# Patient Record
Sex: Male | Born: 1965 | Race: White | Hispanic: No | Marital: Married | Smoking: Never smoker
Health system: Southern US, Community
[De-identification: ages and names within clinical notes are randomized; demographics above are authoritative.]

## PROBLEM LIST (undated history)

## (undated) DIAGNOSIS — K625 Hemorrhage of anus and rectum: Secondary | ICD-10-CM

## (undated) DIAGNOSIS — I889 Nonspecific lymphadenitis, unspecified: Secondary | ICD-10-CM

## (undated) DIAGNOSIS — E349 Endocrine disorder, unspecified: Secondary | ICD-10-CM

## (undated) DIAGNOSIS — E785 Hyperlipidemia, unspecified: Secondary | ICD-10-CM

## (undated) DIAGNOSIS — E559 Vitamin D deficiency, unspecified: Secondary | ICD-10-CM

## (undated) HISTORY — PX: NO PAST SURGERIES: SHX2092

## (undated) HISTORY — DX: Vitamin D deficiency, unspecified: E55.9

## (undated) HISTORY — DX: Hyperlipidemia, unspecified: E78.5

## (undated) HISTORY — DX: Nonspecific lymphadenitis, unspecified: I88.9

## (undated) HISTORY — DX: Hemorrhage of anus and rectum: K62.5

## (undated) HISTORY — DX: Endocrine disorder, unspecified: E34.9

---

## 2004-04-13 ENCOUNTER — Ambulatory Visit: Payer: Self-pay | Admitting: Internal Medicine

## 2004-10-12 ENCOUNTER — Ambulatory Visit: Payer: Self-pay | Admitting: Internal Medicine

## 2005-01-12 ENCOUNTER — Ambulatory Visit: Payer: Self-pay | Admitting: Internal Medicine

## 2005-02-16 ENCOUNTER — Ambulatory Visit: Payer: Self-pay | Admitting: Internal Medicine

## 2005-05-19 ENCOUNTER — Ambulatory Visit: Payer: Self-pay | Admitting: Internal Medicine

## 2005-11-18 ENCOUNTER — Ambulatory Visit: Payer: Self-pay | Admitting: Internal Medicine

## 2005-11-25 ENCOUNTER — Ambulatory Visit: Payer: Self-pay | Admitting: Internal Medicine

## 2006-01-04 ENCOUNTER — Ambulatory Visit (HOSPITAL_BASED_OUTPATIENT_CLINIC_OR_DEPARTMENT_OTHER): Admission: RE | Admit: 2006-01-04 | Discharge: 2006-01-04 | Payer: Self-pay | Admitting: Internal Medicine

## 2006-01-12 ENCOUNTER — Ambulatory Visit: Payer: Self-pay | Admitting: Pulmonary Disease

## 2006-02-24 ENCOUNTER — Ambulatory Visit: Payer: Self-pay | Admitting: Internal Medicine

## 2006-02-24 LAB — CONVERTED CEMR LAB
ALT: 26 units/L (ref 0–40)
AST: 19 units/L (ref 0–37)
Alkaline Phosphatase: 42 units/L (ref 39–117)
Bilirubin, Direct: 0.2 mg/dL (ref 0.0–0.3)
Cholesterol: 222 mg/dL (ref 0–200)
Total Protein: 6.6 g/dL (ref 6.0–8.3)

## 2006-03-03 ENCOUNTER — Ambulatory Visit: Payer: Self-pay | Admitting: Internal Medicine

## 2006-05-26 ENCOUNTER — Ambulatory Visit: Payer: Self-pay | Admitting: Internal Medicine

## 2006-05-26 LAB — CONVERTED CEMR LAB
AST: 21 units/L (ref 0–37)
Albumin: 4.1 g/dL (ref 3.5–5.2)
Alkaline Phosphatase: 42 units/L (ref 39–117)
Total Bilirubin: 0.9 mg/dL (ref 0.3–1.2)
VLDL: 22 mg/dL (ref 0–40)

## 2006-06-02 ENCOUNTER — Ambulatory Visit: Payer: Self-pay | Admitting: Internal Medicine

## 2006-09-06 ENCOUNTER — Ambulatory Visit: Payer: Self-pay | Admitting: Internal Medicine

## 2006-09-06 LAB — CONVERTED CEMR LAB
ALT: 20 units/L (ref 0–40)
Alkaline Phosphatase: 34 units/L — ABNORMAL LOW (ref 39–117)
Bilirubin, Direct: 0.1 mg/dL (ref 0.0–0.3)
Cholesterol: 201 mg/dL (ref 0–200)
Total CHOL/HDL Ratio: 6.1
Total Protein: 6.6 g/dL (ref 6.0–8.3)
Triglycerides: 104 mg/dL (ref 0–149)
VLDL: 21 mg/dL (ref 0–40)

## 2006-09-13 ENCOUNTER — Ambulatory Visit: Payer: Self-pay | Admitting: Internal Medicine

## 2006-11-07 DIAGNOSIS — E785 Hyperlipidemia, unspecified: Secondary | ICD-10-CM

## 2006-12-14 ENCOUNTER — Ambulatory Visit: Payer: Self-pay | Admitting: Internal Medicine

## 2006-12-14 LAB — CONVERTED CEMR LAB
AST: 23 units/L (ref 0–37)
Albumin: 4.1 g/dL (ref 3.5–5.2)
Bilirubin, Direct: 0.2 mg/dL (ref 0.0–0.3)
Cholesterol: 238 mg/dL (ref 0–200)
Direct LDL: 186.6 mg/dL
HDL: 36.3 mg/dL — ABNORMAL LOW (ref >39.0)
Total Bilirubin: 0.9 mg/dL (ref 0.3–1.2)
Triglycerides: 91 mg/dL (ref 0–149)

## 2006-12-21 ENCOUNTER — Ambulatory Visit: Payer: Self-pay | Admitting: Internal Medicine

## 2006-12-21 DIAGNOSIS — I10 Essential (primary) hypertension: Secondary | ICD-10-CM | POA: Insufficient documentation

## 2006-12-21 DIAGNOSIS — H66019 Acute suppurative otitis media with spontaneous rupture of ear drum, unspecified ear: Secondary | ICD-10-CM

## 2006-12-21 LAB — CONVERTED CEMR LAB
Cholesterol, target level: 200 mg/dL
HDL goal, serum: 40 mg/dL
LDL Goal: 130 mg/dL

## 2006-12-26 ENCOUNTER — Telehealth: Payer: Self-pay | Admitting: Internal Medicine

## 2006-12-29 ENCOUNTER — Encounter: Payer: Self-pay | Admitting: Internal Medicine

## 2007-05-24 ENCOUNTER — Telehealth (INDEPENDENT_AMBULATORY_CARE_PROVIDER_SITE_OTHER): Payer: Self-pay | Admitting: *Deleted

## 2007-05-26 ENCOUNTER — Ambulatory Visit: Payer: Self-pay | Admitting: Internal Medicine

## 2007-05-26 LAB — CONVERTED CEMR LAB
ALT: 19 units/L (ref 0–53)
AST: 19 units/L (ref 0–37)
Albumin: 4.3 g/dL (ref 3.5–5.2)
Alkaline Phosphatase: 41 units/L (ref 39–117)
Bilirubin, Direct: 0.2 mg/dL (ref 0.0–0.3)
Cholesterol: 241 mg/dL (ref 0–200)
Total CHOL/HDL Ratio: 6.8
VLDL: 30 mg/dL (ref 0–40)

## 2007-06-13 ENCOUNTER — Ambulatory Visit: Payer: Self-pay | Admitting: Internal Medicine

## 2007-08-04 ENCOUNTER — Ambulatory Visit: Payer: Self-pay | Admitting: Internal Medicine

## 2007-08-04 LAB — CONVERTED CEMR LAB
AST: 17 units/L (ref 0–37)
Albumin: 4 g/dL (ref 3.5–5.2)
HDL: 28.9 mg/dL — ABNORMAL LOW (ref >39.0)
LDL Cholesterol: 103 mg/dL — ABNORMAL HIGH (ref 0–99)
Total Bilirubin: 0.7 mg/dL (ref 0.3–1.2)
Total CHOL/HDL Ratio: 5.1
Triglycerides: 74 mg/dL (ref 0–149)
VLDL: 15 mg/dL (ref 0–40)

## 2007-08-24 ENCOUNTER — Telehealth: Payer: Self-pay | Admitting: Internal Medicine

## 2007-08-24 ENCOUNTER — Ambulatory Visit: Payer: Self-pay | Admitting: Internal Medicine

## 2007-08-24 DIAGNOSIS — L259 Unspecified contact dermatitis, unspecified cause: Secondary | ICD-10-CM

## 2007-11-28 ENCOUNTER — Ambulatory Visit: Payer: Self-pay | Admitting: Internal Medicine

## 2007-11-28 DIAGNOSIS — J069 Acute upper respiratory infection, unspecified: Secondary | ICD-10-CM | POA: Insufficient documentation

## 2007-12-26 ENCOUNTER — Ambulatory Visit: Payer: Self-pay | Admitting: Internal Medicine

## 2007-12-26 DIAGNOSIS — K625 Hemorrhage of anus and rectum: Secondary | ICD-10-CM

## 2008-01-10 ENCOUNTER — Telehealth: Payer: Self-pay | Admitting: Internal Medicine

## 2008-03-12 ENCOUNTER — Telehealth: Payer: Self-pay | Admitting: Internal Medicine

## 2008-06-18 ENCOUNTER — Ambulatory Visit: Payer: Self-pay | Admitting: Internal Medicine

## 2008-06-18 DIAGNOSIS — E8881 Metabolic syndrome: Secondary | ICD-10-CM | POA: Insufficient documentation

## 2008-08-23 ENCOUNTER — Ambulatory Visit: Payer: Self-pay | Admitting: Internal Medicine

## 2008-08-23 LAB — CONVERTED CEMR LAB
Albumin: 4.1 g/dL (ref 3.5–5.2)
BUN: 13 mg/dL (ref 6–23)
Basophils Absolute: 0 10*3/uL (ref 0.0–0.1)
Bilirubin Urine: NEGATIVE
CO2: 30 meq/L (ref 19–32)
Calcium: 9.1 mg/dL (ref 8.4–10.5)
Cholesterol: 172 mg/dL (ref 0–200)
Eosinophils Absolute: 0.2 10*3/uL (ref 0.0–0.7)
Glucose, Bld: 89 mg/dL (ref 70–99)
Glucose, Urine, Semiquant: NEGATIVE
HCT: 46 % (ref 39.0–52.0)
HDL: 35.1 mg/dL — ABNORMAL LOW (ref >39.00)
Hemoglobin: 15.9 g/dL (ref 13.0–17.0)
Lymphs Abs: 1.2 10*3/uL (ref 0.7–4.0)
MCHC: 34.6 g/dL (ref 30.0–36.0)
Neutro Abs: 3.5 10*3/uL (ref 1.4–7.7)
Platelets: 167 10*3/uL (ref 150.0–400.0)
Potassium: 4.2 meq/L (ref 3.5–5.1)
RDW: 12.1 % (ref 11.5–14.6)
Sodium: 146 meq/L — ABNORMAL HIGH (ref 135–145)
Specific Gravity, Urine: 1.025
TSH: 1.89 microintl units/mL (ref 0.35–5.50)
Total Bilirubin: 0.8 mg/dL (ref 0.3–1.2)
VLDL: 22.2 mg/dL (ref 0.0–40.0)
WBC Urine, dipstick: NEGATIVE
pH: 6

## 2008-09-18 ENCOUNTER — Ambulatory Visit: Payer: Self-pay | Admitting: Internal Medicine

## 2008-09-18 DIAGNOSIS — F4001 Agoraphobia with panic disorder: Secondary | ICD-10-CM

## 2008-11-20 ENCOUNTER — Ambulatory Visit: Payer: Self-pay | Admitting: Internal Medicine

## 2009-05-27 ENCOUNTER — Ambulatory Visit: Payer: Self-pay | Admitting: Internal Medicine

## 2009-06-06 ENCOUNTER — Ambulatory Visit: Payer: Self-pay | Admitting: Internal Medicine

## 2009-06-06 LAB — CONVERTED CEMR LAB
ALT: 32 units/L (ref 0–53)
AST: 26 units/L (ref 0–37)
Albumin: 4.4 g/dL (ref 3.5–5.2)
Cholesterol: 151 mg/dL (ref 0–200)
HDL: 43.7 mg/dL (ref >39.00)
Total Protein: 7.8 g/dL (ref 6.0–8.3)
Triglycerides: 83 mg/dL (ref 0.0–149.0)

## 2009-06-16 ENCOUNTER — Ambulatory Visit: Payer: Self-pay | Admitting: Internal Medicine

## 2009-06-16 LAB — CONVERTED CEMR LAB: Cholesterol, target level: 200 mg/dL

## 2009-09-02 ENCOUNTER — Ambulatory Visit: Payer: Self-pay | Admitting: Internal Medicine

## 2009-12-01 ENCOUNTER — Ambulatory Visit: Payer: Self-pay | Admitting: Internal Medicine

## 2009-12-01 LAB — CONVERTED CEMR LAB
ALT: 20 units/L (ref 0–53)
AST: 17 units/L (ref 0–37)
Alkaline Phosphatase: 44 units/L (ref 39–117)
BUN: 13 mg/dL (ref 6–23)
Bilirubin Urine: NEGATIVE
Bilirubin, Direct: 0.1 mg/dL (ref 0.0–0.3)
Cholesterol: 142 mg/dL (ref 0–200)
Creatinine, Ser: 1 mg/dL (ref 0.4–1.5)
Eosinophils Relative: 2.9 % (ref 0.0–5.0)
GFR calc non Af Amer: 84.79 mL/min (ref >60)
HCT: 46.3 % (ref 39.0–52.0)
LDL Cholesterol: 94 mg/dL (ref 0–99)
Monocytes Relative: 11.5 % (ref 3.0–12.0)
Neutrophils Relative %: 61.3 % (ref 43.0–77.0)
Nitrite: NEGATIVE
Platelets: 176 10*3/uL (ref 150.0–400.0)
Potassium: 4.6 meq/L (ref 3.5–5.1)
Protein, U semiquant: NEGATIVE
Total Bilirubin: 0.7 mg/dL (ref 0.3–1.2)
Urobilinogen, UA: 0.2
VLDL: 16 mg/dL (ref 0.0–40.0)
WBC: 5.7 10*3/uL (ref 4.5–10.5)

## 2009-12-08 ENCOUNTER — Ambulatory Visit: Payer: Self-pay | Admitting: Internal Medicine

## 2010-01-06 ENCOUNTER — Ambulatory Visit: Payer: Self-pay | Admitting: Family Medicine

## 2010-01-06 DIAGNOSIS — H538 Other visual disturbances: Secondary | ICD-10-CM | POA: Insufficient documentation

## 2010-01-06 DIAGNOSIS — M79609 Pain in unspecified limb: Secondary | ICD-10-CM

## 2010-03-09 ENCOUNTER — Ambulatory Visit: Payer: Self-pay | Admitting: Internal Medicine

## 2010-03-12 ENCOUNTER — Telehealth: Payer: Self-pay | Admitting: Internal Medicine

## 2010-04-21 NOTE — Assessment & Plan Note (Signed)
Summary: VISION DISTURBANCE / R LEG PAIN // RS   Vital Signs:  Patient profile:   45 year old male Height:      75 inches (190.50 cm) Weight:      246 pounds (111.82 kg) O2 Sat:      98 % on Room air Temp:     98.1 degrees F (36.72 degrees C) oral Pulse rate:   99 / minute BP sitting:   126 / 80  (left arm) Cuff size:   large  Vitals Entered By: Josph Macho RMA (January 06, 2010 2:31 PM)  O2 Flow:  Room air CC: Last Thursday played golf and couldn't see anything for about 20 minutes. Friday right calf felt like there was a "heartbeat" in it- Lasted all weekend/ CF Is Patient Diabetic? No   History of Present Illness: Patient in today to discuss 2 recent episodes that concern him. Last week on Thursday he was playing golf when he had an episode of b/l blurry vision, he denies any HA, trauma. Did have some floating black spots in his visual fields right before the blurring and acknowledges he has had them before. Denies migraines. During discussion he acknowledges he does not routinely exercise, does not get 7 hours sleep, is eating and hydrating poorly and is under a great deal of stress. Denies any CP/palp/SOB/f/c/cough/recent illness/GI or GU  c/o. His other c/o is some throbbing in his right calf  which began last Friday and continued over the weekend. Even kept him up at night. Now has resolved. There was no trauma/redness/warmth or swelling. Spoke with his optometrist and he did not voice any concerns regarding the visual episode and suggested it might be an occular migraine  Current Medications (verified): 1)  Crestor 10 Mg  Tabs (Rosuvastatin Calcium) .... One By Mouth Daily 2)  Lotrisone 1-0.05 % Lotn (Clotrimazole-Betamethasone) .... Generic Ok 5 Drops in Each Ear Daily or As Needed 3)  Zolpidem Tartrate 10 Mg Tabs (Zolpidem Tartrate) .... One By Mouth Q Hs As Needed ( Plan To Skip At Least 1 Night A Week)  Allergies (verified): No Known Drug Allergies  Past  History:  Past medical history reviewed for relevance to current acute and chronic problems. Social history (including risk factors) reviewed for relevance to current acute and chronic problems.  Past Medical History: Reviewed history from 12/21/2006 and no changes required. Hyperlipidemia Sleep Apnea Internal Hemorrhoids Hypertension  Social History: Reviewed history from 11/07/2006 and no changes required. Never Smoked Alcohol use-yes Married  Review of Systems      See HPI  Physical Exam  General:  Well-developed,well-nourished,in no acute distress; alert,appropriate and cooperative throughout examination Head:  Normocephalic and atraumatic without obvious abnormalities. No apparent alopecia or balding. Mouth:  Oral mucosa and oropharynx without lesions or exudates.  Teeth in good repair. Neck:  No deformities, masses, or tenderness noted. Lungs:  Normal respiratory effort, chest expands symmetrically. Lungs are clear to auscultation, no crackles or wheezes. Heart:  Normal rate and regular rhythm. S1 and S2 normal without gallop, murmur, click, rub or other extra sounds. Abdomen:  Bowel sounds positive,abdomen soft and non-tender without masses, organomegaly or hernias noted. Extremities:  No clubbing, cyanosis, edema, or deformity noted with normal full range of motion of all joints.   Cervical Nodes:  No lymphadenopathy noted Psych:  Cognition and judgment appear intact. Alert and cooperative with normal attention span and concentration. No apparent delusions, illusions, hallucinationsslightly anxious.     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 Is to report a recurrent episode and is going to see his optometrist. Suggested 8 hour sleep, exercise, increased hydration, improved eating habits.  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 One by mouth daily 2)  Lotrisone 1-0.05 % Lotn (Clotrimazole-betamethasone) .... Generic ok 5 drops in each ear daily or as needed 3)  Zolpidem Tartrate 10 Mg Tabs (Zolpidem tartrate) .... One by mouth q hs as needed ( plan to skip at least 1 night a week) 4)  Ativan 0.5 Mg Tabs (Lorazepam) .... 1/2 to 1 tab daily as needed anxiety/insomnia/ha  Patient Instructions: 1)  Please schedule a follow-up appointment in 1 month.  2)  Limit your Sodium(salt) .  3)  Check your  Blood Pressure regularly . If it is above:   160 on top you should make an appointment. Prescriptions: ATIVAN 0.5 MG TABS (LORAZEPAM) 1/2 to 1 tab daily as needed anxiety/insomnia/HA  #10 x 0   Entered and Authorized by:   Danise Edge MD   Signed by:   Danise Edge MD on 01/06/2010   Method used:   Print then Give to Patient   RxID:   [id number] ATIVAN 0.5 MG TABS (LORAZEPAM) 1/2 to 1 tab daily as needed anxiety/insomnia/HA  #10 x 0   Entered and Authorized by:   Danise Edge MD   Signed by:   Danise Edge MD on 01/06/2010   Method used:   Print then Give to Patient   RxID:   [id number]    Orders Added: 1)  Est. Patient Level IV [59563]

## 2010-04-21 NOTE — Assessment & Plan Note (Signed)
Summary: CPX // RS   Vital Signs:  Patient profile:   45 year old male Height:      75 inches Weight:      248 pounds BMI:     31.11 Temp:     98.2 degrees F oral Pulse rate:   72 / minute Resp:     14 per minute BP sitting:   130 / 80  (left arm)  Vitals Entered By: Willy Eddy, LPN (December 08, 2009 11:05 AM) CC: cpx Is Patient Diabetic? No   Primary Care Marcelus Dubberly:  Stacie Glaze MD  CC:  cpx.  History of Present Illness: The pt was asked about all immunizations, health maint. services that are appropriate to their age and was given guidance on diet exercize  and weight management   Preventive Screening-Counseling & Management  Alcohol-Tobacco     Smoking Status: never     Tobacco Counseling: not indicated; no tobacco use  Problems Prior to Update: 1)  Agoraphobia With Panic Disorder  (ICD-300.21) 2)  Physical Examination  (ICD-V70.0) 3)  Dysmetabolic Syndrome X  (ICD-277.7) 4)  Rectal Bleeding  (ICD-569.3) 5)  Uri  (ICD-465.9) 6)  Contact Dermatitis&other Eczema Due Unspec Cause  (ICD-692.9) 7)  Om, Acute Suppurative W/drum Rupture  (ICD-382.01) 8)  Hypertension  (ICD-401.9) 9)  Family History Diabetes 1st Degree Relative  (ICD-V18.0) 10)  Hyperlipidemia  (ICD-272.4)  Current Problems (verified): 1)  Agoraphobia With Panic Disorder  (ICD-300.21) 2)  Physical Examination  (ICD-V70.0) 3)  Dysmetabolic Syndrome X  (ICD-277.7) 4)  Rectal Bleeding  (ICD-569.3) 5)  Uri  (ICD-465.9) 6)  Contact Dermatitis&other Eczema Due Unspec Cause  (ICD-692.9) 7)  Om, Acute Suppurative W/drum Rupture  (ICD-382.01) 8)  Hypertension  (ICD-401.9) 9)  Family History Diabetes 1st Degree Relative  (ICD-V18.0) 10)  Hyperlipidemia  (ICD-272.4)  Medications Prior to Update: 1)  Crestor 10 Mg  Tabs (Rosuvastatin Calcium) .... One By Mouth Daily 2)  Lotrisone 1-0.05 % Lotn (Clotrimazole-Betamethasone) .... Generic Ok 5 Drops in Each Ear Daily or As Needed 3)  Zolpidem  Tartrate 10 Mg Tabs (Zolpidem Tartrate) .... One By Mouth Q Hs As Needed ( Plan To Skip At Least 1 Night A Week)  Current Medications (verified): 1)  Crestor 10 Mg  Tabs (Rosuvastatin Calcium) .... One By Mouth Daily 2)  Lotrisone 1-0.05 % Lotn (Clotrimazole-Betamethasone) .... Generic Ok 5 Drops in Each Ear Daily or As Needed 3)  Zolpidem Tartrate 10 Mg Tabs (Zolpidem Tartrate) .... One By Mouth Q Hs As Needed ( Plan To Skip At Least 1 Night A Week)  Allergies (verified): No Known Drug Allergies  Past History:  Family History: Last updated: 11/07/2006 Family History Diabetes 1st degree relative Family History Lung cancer  Social History: Last updated: 11/07/2006 Never Smoked Alcohol use-yes Married  Risk Factors: Smoking Status: never (12/08/2009)  Past medical, surgical, family and social histories (including risk factors) reviewed, and no changes noted (except as noted below).  Past Medical History: Reviewed history from 12/21/2006 and no changes required. Hyperlipidemia Sleep Apnea Internal Hemorrhoids Hypertension  Past Surgical History: Reviewed history from 12/21/2006 and no changes required. Denies surgical history  Family History: Reviewed history from 11/07/2006 and no changes required. Family History Diabetes 1st degree relative Family History Lung cancer  Social History: Reviewed history from 11/07/2006 and no changes required. Never Smoked Alcohol use-yes Married  Review of Systems  The patient denies anorexia, fever, weight loss, weight gain, vision loss, decreased hearing, hoarseness, chest pain,  syncope, dyspnea on exertion, peripheral edema, prolonged cough, headaches, hemoptysis, abdominal pain, melena, hematochezia, severe indigestion/heartburn, hematuria, incontinence, genital sores, muscle weakness, suspicious skin lesions, transient blindness, difficulty walking, depression, unusual weight change, abnormal bleeding, enlarged lymph nodes,  angioedema, breast masses, and testicular masses.         Flu Vaccine Consent Questions     Do you have a history of severe allergic reactions to this vaccine? no    Any prior history of allergic reactions to egg and/or gelatin? no    Do you have a sensitivity to the preservative Thimersol? no    Do you have a past history of Guillan-Barre Syndrome? no    Do you currently have an acute febrile illness? no    Have you ever had a severe reaction to latex? no    Vaccine information given and explained to patient? yes    Are you currently pregnant? no    Lot Number:[id number]   Exp Date:09/19/2010   Site Given  Left Deltoid IM   Physical Exam  General:  alert and overweight-appearing.   Head:  normocephalic and atraumatic.   Eyes:  pupils equal and pupils round.   Ears:  R ear normal and L ear normal.   Nose:  External nasal examination shows no deformity or inflammation. Nasal mucosa are pink and moist without lesions or exudates. Mouth:  Oral mucosa and oropharynx without lesions or exudates.  Teeth in good repair. Neck:  No deformities, masses, or tenderness noted. Lungs:  normal respiratory effort and no wheezes.   Heart:  normal rate and regular rhythm.   Abdomen:  soft, non-tender, and normal bowel sounds.   Rectal:  no external abnormalities and normal sphincter tone.   Genitalia:  circumcised and no urethral discharge.   Prostate:  no gland enlargement and no asymmetry.   Msk:  no joint tenderness and no joint swelling.   Pulses:  R and L carotid,radial,femoral,dorsalis pedis and posterior tibial pulses are full and equal bilaterally Extremities:  No clubbing, cyanosis, edema, or deformity noted with normal full range of motion of all joints.   Neurologic:  alert & oriented X3, DTRs symmetrical and normal, and finger-to-nose normal.     Impression & Recommendations:  Problem # 1:  PHYSICAL EXAMINATION (ICD-V70.0) The pt was asked about all immunizations, health maint.  services that are appropriate to their age and was given guidance on diet exercize  and weight management  Orders: EKG w/ Interpretation (93000)  Td Booster: Tdap (06/18/2008)   Flu Vax: Fluvax 3+ (12/08/2009)   Chol: 142 (12/01/2009)   HDL: 32.30 (12/01/2009)   LDL: 94 (12/01/2009)   TG: 80.0 (12/01/2009) TSH: 1.89 (12/01/2009)    Discussed using sunscreen, use of alcohol, drug use, self testicular exam, routine dental care, routine eye care, routine physical exam, seat belts, multiple vitamins, osteoporosis prevention, adequate calcium intake in diet, and recommendations for immunizations.  Discussed exercise and checking cholesterol.  Discussed gun safety, safe sex, and contraception. Also recommend checking PSA.  Complete Medication List: 1)  Crestor 10 Mg Tabs (Rosuvastatin calcium) .... One by mouth daily 2)  Lotrisone 1-0.05 % Lotn (Clotrimazole-betamethasone) .... Generic ok 5 drops in each ear daily or as needed 3)  Zolpidem Tartrate 10 Mg Tabs (Zolpidem tartrate) .... One by mouth q hs as needed ( plan to skip at least 1 night a week)  Other Orders: Admin 1st Vaccine (16109) Flu Vaccine 17yrs + (60454)  Patient Instructions: 1)  It is important that  you exercise regularly at least 30  minutes 5 times a week. If you develop chest pain, have severe difficulty breathing, or feel very tired , stop exercising immediately and seek medical attention. 2)  You need to lose weight. Consider a lower calorie diet and regular exercise.  3)  goal weight for ideal BMI is 210 fro yout height and age 86)  set reasonable intermediate goals 5)  Please schedule a follow-up appointment in 3 months.

## 2010-04-21 NOTE — Letter (Signed)
Summary: External Other  External Other   Imported By: Everrett Coombe 09/02/2009 09:49:44  _____________________________________________________________________  External Attachment:    Type:   Image     Comment:   External Document

## 2010-04-21 NOTE — Assessment & Plan Note (Signed)
Summary: FU ON LABS/[facility]   Vital Signs:  Patient profile:   45 year old male Height:      75 inches Weight:      249 pounds BMI:     31.24 Temp:     98.2 degrees F oral Pulse rate:   72 / minute Resp:     14 per minute BP sitting:   130 / 80  (left arm)  Vitals Entered By: Willy Eddy, LPN (June 16, 2009 11:50 AM) CC: roa labs, Lipid Management   CC:  roa labs and Lipid Management.  History of Present Illness: follow up lipids  Hyperlipidemia Follow-Up      This is a 45 year old man who presents for Hyperlipidemia follow-up.  The patient denies muscle aches, GI upset, abdominal pain, flushing, itching, constipation, diarrhea, and fatigue.  The patient denies the following symptoms: chest pain/pressure, exercise intolerance, dypsnea, palpitations, syncope, and pedal edema.  Compliance with medications (by patient report) has been near 100%.  Dietary compliance has been excellent.  The patient reports exercising daily.    Lipid Management History:      Positive NCEP/ATP III risk factors include hypertension.  Negative NCEP/ATP III risk factors include male age less than 60 years old, non-diabetic, non-tobacco-user status, no ASHD (atherosclerotic heart disease), no prior stroke/TIA, no peripheral vascular disease, and no history of aortic aneurysm.     Preventive Screening-Counseling & Management  Alcohol-Tobacco     Smoking Status: never  Problems Prior to Update: 1)  Agoraphobia With Panic Disorder  (ICD-300.21) 2)  Physical Examination  (ICD-V70.0) 3)  Dysmetabolic Syndrome X  (ICD-277.7) 4)  Rectal Bleeding  (ICD-569.3) 5)  Uri  (ICD-465.9) 6)  Contact Dermatitis&other Eczema Due Unspec Cause  (ICD-692.9) 7)  Om, Acute Suppurative W/drum Rupture  (ICD-382.01) 8)  Hypertension  (ICD-401.9) 9)  Family History Diabetes 1st Degree Relative  (ICD-V18.0) 10)  Hyperlipidemia  (ICD-272.4)  Current Problems (verified): 1)  Agoraphobia With Panic Disorder   (ICD-300.21) 2)  Physical Examination  (ICD-V70.0) 3)  Dysmetabolic Syndrome X  (ICD-277.7) 4)  Rectal Bleeding  (ICD-569.3) 5)  Uri  (ICD-465.9) 6)  Contact Dermatitis&other Eczema Due Unspec Cause  (ICD-692.9) 7)  Om, Acute Suppurative W/drum Rupture  (ICD-382.01) 8)  Hypertension  (ICD-401.9) 9)  Family History Diabetes 1st Degree Relative  (ICD-V18.0) 10)  Hyperlipidemia  (ICD-272.4)  Medications Prior to Update: 1)  Crestor 10 Mg  Tabs (Rosuvastatin Calcium) .... One By Mouth Daily 2)  Lotrisone 1-0.05 % Lotn (Clotrimazole-Betamethasone) .... Generic Ok 5 Drops in Each Ear Daily or As Needed 3)  Zolpidem Tartrate 10 Mg Tabs (Zolpidem Tartrate) .... One By Mouth Q Hs As Needed ( Plan To Skip At Least 1 Night A Week)  Current Medications (verified): 1)  Crestor 10 Mg  Tabs (Rosuvastatin Calcium) .... One By Mouth Daily 2)  Lotrisone 1-0.05 % Lotn (Clotrimazole-Betamethasone) .... Generic Ok 5 Drops in Each Ear Daily or As Needed 3)  Zolpidem Tartrate 10 Mg Tabs (Zolpidem Tartrate) .... One By Mouth Q Hs As Needed ( Plan To Skip At Least 1 Night A Week)  Allergies (verified): No Known Drug Allergies  Past History:  Family History: Last updated: 11/07/2006 Family History Diabetes 1st degree relative Family History Lung cancer  Social History: Last updated: 11/07/2006 Never Smoked Alcohol use-yes Married  Risk Factors: Smoking Status: never (06/16/2009)  Past medical, surgical, family and social histories (including risk factors) reviewed, and no changes noted (except as noted below).  Past Medical History: Reviewed history from 12/21/2006 and no changes required. Hyperlipidemia Sleep Apnea Internal Hemorrhoids Hypertension  Past Surgical History: Reviewed history from 12/21/2006 and no changes required. Denies surgical history  Family History: Reviewed history from 11/07/2006 and no changes required. Family History Diabetes 1st degree relative Family History  Lung cancer  Social History: Reviewed history from 11/07/2006 and no changes required. Never Smoked Alcohol use-yes Married  Review of Systems  The patient denies anorexia, fever, weight loss, weight gain, vision loss, decreased hearing, hoarseness, chest pain, syncope, dyspnea on exertion, peripheral edema, prolonged cough, headaches, hemoptysis, abdominal pain, melena, hematochezia, severe indigestion/heartburn, hematuria, incontinence, genital sores, muscle weakness, suspicious skin lesions, transient blindness, difficulty walking, depression, unusual weight change, abnormal bleeding, enlarged lymph nodes, angioedema, and breast masses.    Physical Exam  General:  alert and overweight-appearing.   Head:  normocephalic and atraumatic.   Eyes:  pupils equal and pupils round.   Nose:  External nasal examination shows no deformity or inflammation. Nasal mucosa are pink and moist without lesions or exudates. Mouth:  Oral mucosa and oropharynx without lesions or exudates.  Teeth in good repair. Neck:  No deformities, masses, or tenderness noted. Lungs:  Normal respiratory effort, chest expands symmetrically. Lungs are clear to auscultation, no crackles or wheezes. Heart:  Normal rate and regular rhythm. S1 and S2 normal without gallop, murmur, click, rub or other extra sounds.   Impression & Recommendations:  Problem # 1:  DYSMETABOLIC SYNDROME X (ICD-277.7)  Problem # 2:  HYPERTENSION (ICD-401.9)  BP today: 130/80 Prior BP: 120/80 (11/20/2008)  10 Yr Risk Heart Disease: 4 % Prior 10 Yr Risk Heart Disease: 11 % (06/18/2008)  Labs Reviewed: K+: 4.2 (08/23/2008) Creat: : 1.1 (08/23/2008)   Chol: 151 (06/06/2009)   HDL: 43.70 (06/06/2009)   LDL: 91 (06/06/2009)   TG: 83.0 (06/06/2009)  Problem # 3:  HYPERLIPIDEMIA (ICD-272.4)  His updated medication list for this problem includes:    Crestor 10 Mg Tabs (Rosuvastatin calcium) ..... One by mouth daily  Labs Reviewed: SGOT: 26  (06/06/2009)   SGPT: 32 (06/06/2009)  Lipid Goals: Chol Goal: 200 (06/16/2009)   HDL Goal: 40 (06/16/2009)   LDL Goal: 160 (06/16/2009)   TG Goal: 150 (06/16/2009)  10 Yr Risk Heart Disease: 4 % Prior 10 Yr Risk Heart Disease: 11 % (06/18/2008)   HDL:43.70 (06/06/2009), 35.10 (08/23/2008)  LDL:91 (06/06/2009), 115 (16/12/9602)  Chol:151 (06/06/2009), 172 (08/23/2008)  Trig:83.0 (06/06/2009), 111.0 (08/23/2008)  Complete Medication List: 1)  Crestor 10 Mg Tabs (Rosuvastatin calcium) .... One by mouth daily 2)  Lotrisone 1-0.05 % Lotn (Clotrimazole-betamethasone) .... Generic ok 5 drops in each ear daily or as needed 3)  Zolpidem Tartrate 10 Mg Tabs (Zolpidem tartrate) .... One by mouth q hs as needed ( plan to skip at least 1 night a week)  Lipid Assessment/Plan:      Based on NCEP/ATP III, the patient's risk factor category is "0-1 risk factors".  The patient's lipid goals are as follows: Total cholesterol goal is 200; LDL cholesterol goal is 160; HDL cholesterol goal is 40; Triglyceride goal is 150.  His LDL cholesterol goal has not been met.  Secondary causes for hyperlipidemia have been ruled out.  He has been counseled on adjunctive measures for lowering his cholesterol and has been provided with dietary instructions.    Patient Instructions: 1)  Please schedule a follow-up appointment in 6 months.  CPX

## 2010-04-21 NOTE — Miscellaneous (Signed)
Summary: Orders Update  Clinical Lists Changes  Orders: Added new Service order of Form Completion [phone]) - Signed

## 2010-04-23 NOTE — Progress Notes (Signed)
Summary: REFILL REQUEST  Phone Note Refill Request Message from:  Patient on March 12, 2010 2:12 PM  Refills Requested: Medication #1:  LOTRISONE 1-0.05 % LOTN GENERIC OK 5 DROPS IN [facility] EAR DAILY OR AS NEEDED   Notes: [facility] [address]    Initial call taken by: Debbra Riding,  March 12, 2010 2:12 PM    Prescriptions: LOTRISONE 1-0.05 % LOTN (CLOTRIMAZOLE-BETAMETHASONE) GENERIC OK 5 DROPS IN [place] EAR DAILY OR AS NEEDED  #15CC x 1   Entered by:   Willy Eddy, LPN   Authorized by:   Stacie Glaze MD   Signed by:   Willy Eddy, LPN on 16/12/9602   Method used:   Electronically to        [facility] [address] (retail)       [address]       [place], [place]  [address]       Ph: [phone]       Fax: [phone]   RxID:   [id number]

## 2010-04-23 NOTE — Assessment & Plan Note (Signed)
Summary: 3 month rov/njr  pt rsc/njr PT RSC/NJR   Vital Signs:  Patient profile:   45 year old male Height:      75 inches Weight:      241 pounds BMI:     30.23 Temp:     98.2 degrees F oral Pulse rate:   72 / minute Resp:     14 per minute BP sitting:   120 / 82  (left arm)  Vitals Entered By: Willy Eddy, LPN (April 16, 2010 8:49 AM) CC: roa-has lost 5 pounds, Hypertension Management, Lipid Management Is Patient Diabetic? No   Primary Care Provider:  Stacie Glaze MD  CC:  roa-has lost 5 pounds, Hypertension Management, and Lipid Management.  History of Present Illness:  patient is a 45 year old white male who presents for followup of hypertension hyperlipidemia lipidemia as well as monitoring of weight.  Hypertension History:      He denies headache, chest pain, palpitations, dyspnea with exertion, orthopnea, PND, peripheral edema, visual symptoms, neurologic problems, syncope, and side effects from treatment.        Positive major cardiovascular risk factors include hyperlipidemia and hypertension.  Negative major cardiovascular risk factors include male age less than 44 years old, no history of diabetes, and non-tobacco-user status.        Further assessment for target organ damage reveals no history of ASHD, stroke/TIA, or peripheral vascular disease.    Lipid Management History:      Positive NCEP/ATP III risk factors include HDL cholesterol less than 40 and hypertension.  Negative NCEP/ATP III risk factors include male age less than 44 years old, non-diabetic, non-tobacco-user status, no ASHD (atherosclerotic heart disease), no prior stroke/TIA, no peripheral vascular disease, and no history of aortic aneurysm.      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) 4)  Physical Examination  (ICD-V70.0) 5)  Dysmetabolic Syndrome X  (ICD-277.7) 6)  Rectal Bleeding  (ICD-569.3) 7)  Uri  (ICD-465.9) 8)  Contact Dermatitis&other Eczema Due Unspec Cause  (ICD-692.9) 9)  Om, Acute Suppurative W/drum Rupture  (ICD-382.01) 10)  Hypertension  (ICD-401.9) 11)  Family History Diabetes 1st Degree Relative  (ICD-V18.0) 12)  Hyperlipidemia  (ICD-272.4)  Current Problems (verified): 1)  Blurred Vision  (ICD-368.8) 2)  Calf Pain, Right  (ICD-729.5) 3)  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 One By Mouth Daily 2)  Lotrisone 1-0.05 % Lotn (Clotrimazole-Betamethasone) .... Generic Ok 5 Drops in Each Ear Daily or As Needed 3)  Zolpidem Tartrate 10 Mg Tabs (Zolpidem Tartrate) .... One By Mouth Q Hs As Needed ( Plan To Skip At Least 1 Night A Week) 4)  Ativan 0.5 Mg Tabs (Lorazepam) .... 1/2 To 1 Tab Daily As Needed Anxiety/insomnia/ha  Current Medications (verified): 1)  Crestor 10 Mg  Tabs (Rosuvastatin Calcium) .... One By Mouth Daily 2)  Lotrisone 1-0.05 % Lotn (Clotrimazole-Betamethasone) .... Generic Ok 5 Drops in Each Ear Daily or As Needed 3)  Zolpidem Tartrate 10 Mg Tabs (Zolpidem Tartrate) .... One By Mouth Q Hs As Needed ( Plan To Skip At Least 1 Night A Week) 4)  Ativan 0.5 Mg Tabs (Lorazepam) .... 1/2 To 1 Tab Daily As Needed Anxiety/insomnia/ha  Allergies (verified): No Known Drug Allergies  Past History:  Family History: Last updated:  11/07/2006 Family History Diabetes 1st degree relative Family History Lung cancer  Social History: Last updated: 11/07/2006 Never Smoked Alcohol use-yes Married  Risk Factors: Smoking Status: never (04/16/2010)  Past medical, surgical, family and social histories (including risk factors) reviewed, and no changes noted (except as noted below).  Past Medical History: Reviewed history from 12/21/2006 and no changes required. Hyperlipidemia Sleep Apnea Internal Hemorrhoids Hypertension  Past Surgical History: Reviewed history from 12/21/2006 and no changes required. Denies surgical history  Family History: Reviewed history from 11/07/2006 and no changes required. Family History Diabetes 1st degree relative Family History Lung cancer  Social History: Reviewed history from 11/07/2006 and no changes required. Never Smoked Alcohol use-yes Married  Review of Systems  The patient denies anorexia, fever, weight loss, weight gain, vision loss, decreased hearing, hoarseness, chest pain, syncope, dyspnea on exertion, peripheral edema, prolonged cough, headaches, hemoptysis, abdominal pain, melena, hematochezia, severe indigestion/heartburn, hematuria, incontinence, genital sores, muscle weakness, suspicious skin lesions, transient blindness, difficulty walking, depression, unusual weight change, abnormal bleeding, enlarged lymph nodes, angioedema, and breast masses.    Physical Exam  General:  Well-developed,well-nourished,in no acute distress; alert,appropriate and cooperative throughout examination Head:  Normocephalic and atraumatic without obvious abnormalities. No apparent alopecia or balding. Eyes:  pupils equal and pupils round.   Ears:  R ear normal and L ear normal.   Nose:  External nasal examination shows no deformity or inflammation. Nasal mucosa are pink and moist without lesions or exudates. Mouth:  Oral mucosa and oropharynx without lesions or exudates.  Teeth in good  repair. Neck:  No deformities, masses, or tenderness noted. Lungs:  Normal respiratory effort, chest expands symmetrically. Lungs are clear to auscultation, no crackles or wheezes. Heart:  Normal rate and regular rhythm. S1 and S2 normal without gallop, murmur, click, rub or other extra sounds. Abdomen:  Bowel sounds positive,abdomen soft and non-tender without masses, organomegaly or hernias noted. Msk:  no joint tenderness and no joint swelling.   Pulses:  R and L carotid,radial,femoral,dorsalis pedis and posterior tibial pulses are full and equal bilaterally Extremities:  No clubbing, cyanosis, edema, or deformity noted with normal full range of motion of all joints.   Neurologic:  alert & oriented X3, DTRs symmetrical and normal, and finger-to-nose normal.     Impression & Recommendations:  Problem # 1:  HYPERTENSION (ICD-401.9)  BP today: 120/82 Prior BP: 126/80 (01/06/2010)  Prior 10 Yr Risk Heart Disease: 4 % (06/16/2009)  Labs Reviewed: K+: 4.6 (12/01/2009) Creat: : 1.0 (12/01/2009)   Chol: 142 (12/01/2009)   HDL: 32.30 (12/01/2009)   LDL: 94 (12/01/2009)   TG: 80.0 (12/01/2009)  Problem # 2:  DYSMETABOLIC SYNDROME X (ICD-277.7) the pt has lost weight  Problem # 3:  HYPERLIPIDEMIA (ICD-272.4) has not been as faithfull with the use of the lipids His updated medication list for this problem includes:    Crestor 10 Mg Tabs (Rosuvastatin calcium) ..... One by mouth daily  Labs Reviewed: SGOT: 17 (12/01/2009)   SGPT: 20 (12/01/2009)  Lipid Goals: Chol Goal: 200 (06/16/2009)   HDL Goal: 40 (06/16/2009)   LDL Goal: 160 (06/16/2009)   TG Goal: 150 (06/16/2009)  Prior 10 Yr Risk Heart Disease: 4 % (06/16/2009)   HDL:32.30 (12/01/2009), 43.70 (06/06/2009)  LDL:94 (12/01/2009), 91 (06/06/2009)  Chol:142 (12/01/2009), 151 (06/06/2009)  Trig:80.0 (12/01/2009), 83.0 (06/06/2009)  Complete Medication List: 1)  Crestor 10 Mg Tabs (Rosuvastatin calcium) .... One by mouth daily 2)   Lotrisone 1-0.05 % Lotn (Clotrimazole-betamethasone) .... Generic ok 5 drops in each ear daily or as needed 3)  Zolpidem Tartrate 10 Mg Tabs (Zolpidem tartrate) .... One by mouth q hs as needed ( plan to skip at least 1 night a week) 4)  Ativan 0.5 Mg Tabs (Lorazepam) .... 1/2 to 1 tab daily as needed anxiety/insomnia/ha  Hypertension Assessment/Plan:      The patient's hypertensive risk group is category B: At least one risk factor (excluding diabetes) with no target organ damage.  His calculated 10 year risk of coronary heart disease is 4 %.  Today's blood pressure is 120/82.  His blood pressure goal is < 140/90.  Lipid Assessment/Plan:      Based on NCEP/ATP III, the patient's risk factor category is "2 or more risk factors and a calculated 10 year CAD risk of < 20%".  The patient's lipid goals are as follows: Total cholesterol goal is 200; LDL cholesterol goal is 160; HDL cholesterol goal is 40; Triglyceride goal is 150.  His LDL cholesterol goal has not been met.  Secondary causes for hyperlipidemia have been ruled out.  He has been counseled on adjunctive measures for lowering his cholesterol and has been provided with dietary instructions.    Patient Instructions: 1)  septemper CPX with labs prior   Orders Added: 1)  Est. Patient Level IV [04540]

## 2010-08-07 NOTE — Procedures (Signed)
NAMEGRAESYN, Andrew Peck                ACCOUNT NO.:  [id number]   MEDICAL RECORD NO.:  [id number]          PATIENT TYPE:  OUT   LOCATION:  SLEEP CENTER                 FACILITY[facility]  [facility]   PHYSICIAN:  Barbaraann Share, MD,FCCPDATE OF BIRTH:  12/02/1966   DATE OF STUDY:  01/04/2006                              NOCTURNAL POLYSOMNOGRAM    REFERRING PHYSICIAN:  Dr. Stacie Glaze   INDICATIONS FOR STUDY:  Hypersomnia with sleep apnea.   EPWORTH SCORE:  5   SLEEP ARCHITECTURE:  The patient had a total sleep time of 383 minutes with  very decreased REM and never achieved slow wave sleep.  Sleep onset latency  was normal, and REM onset was prolonged at 188 minutes.  Sleep efficiency  was adequate at 93%.   RESPIRATORY DATA:  The patient was found to have 84 hypopneas, 8 obstructive  apneas, and 1 central apnea for a respiratory disturbance index of 15 events  per hours.  The events were clearly worse in the supine position, and there  was very loud snoring noted throughout the study.   OXYGEN DATA:  There was O2 desaturation as low as 87% with the patient's  obstructive events.   CARDIAC DATA:  No clinically significant cardiac arrhythmias.   MOVEMENT/PARASOMNIA:  The patient was found to have 82 leg jerks with less  than 1 per hour resulting in arousal or awakening.  This is most likely  clinically insignificant.   IMPRESSION/RECOMMENDATIONS:  Mild obstructive sleep apnea/hypopnea syndrome  with a respiratory disturbance index of 15 events per hour and O2  desaturation as low as 87%.  Treatment for this degree of sleep apnea should  include weight loss if applicable, upper airway surgery or appliance, and  also CPAP. Clinical correlation is suggested.           ______________________________  Barbaraann Share, MD,FCCP  Diplomate, American Board of Sleep  Medicine     [facility]/MEDQ  D:  01/13/2006 18:16:56  T:  01/14/2006 22:50:39  Job:  [id number]

## 2010-08-21 ENCOUNTER — Telehealth: Payer: Self-pay | Admitting: Internal Medicine

## 2010-08-21 NOTE — Telephone Encounter (Signed)
Pt called req status of medical forms for [employer], that pt dropped off a few wks ago. Pt has to turn forms in today. Pls call when ready for pick up.

## 2010-08-21 NOTE — Telephone Encounter (Signed)
Completed and pt has picked up

## 2010-11-04 ENCOUNTER — Other Ambulatory Visit: Payer: Self-pay | Admitting: Internal Medicine

## 2011-08-07 ENCOUNTER — Other Ambulatory Visit: Payer: Self-pay | Admitting: Internal Medicine

## 2011-10-21 ENCOUNTER — Other Ambulatory Visit: Payer: Self-pay | Admitting: Internal Medicine

## 2011-11-04 ENCOUNTER — Other Ambulatory Visit: Payer: Self-pay | Admitting: *Deleted

## 2011-11-04 MED ORDER — ROSUVASTATIN CALCIUM 10 MG PO TABS: 10.0000 mg | ORAL_TABLET | Freq: Every day | ORAL | Status: DC

## 2012-01-14 LAB — PULMONARY FUNCTION TEST

## 2015-05-26 ENCOUNTER — Encounter: Payer: Self-pay | Admitting: Family Medicine

## 2015-05-27 ENCOUNTER — Encounter: Payer: Self-pay | Admitting: Family Medicine

## 2015-05-27 ENCOUNTER — Ambulatory Visit (INDEPENDENT_AMBULATORY_CARE_PROVIDER_SITE_OTHER): Payer: BLUE CROSS/BLUE SHIELD | Admitting: Family Medicine

## 2015-05-27 VITALS — BP 125/84 | HR 95 | Temp 98.5°F | Resp 20 | Ht 75.0 in | Wt 260.0 lb

## 2015-05-27 DIAGNOSIS — H53139 Sudden visual loss, unspecified eye: Secondary | ICD-10-CM | POA: Insufficient documentation

## 2015-05-27 DIAGNOSIS — Z1321 Encounter for screening for nutritional disorder: Secondary | ICD-10-CM

## 2015-05-27 DIAGNOSIS — G5793 Unspecified mononeuropathy of bilateral lower limbs: Secondary | ICD-10-CM

## 2015-05-27 DIAGNOSIS — R5383 Other fatigue: Secondary | ICD-10-CM | POA: Insufficient documentation

## 2015-05-27 DIAGNOSIS — Z13220 Encounter for screening for lipoid disorders: Secondary | ICD-10-CM

## 2015-05-27 DIAGNOSIS — G5790 Unspecified mononeuropathy of unspecified lower limb: Secondary | ICD-10-CM | POA: Insufficient documentation

## 2015-05-27 DIAGNOSIS — Z0000 Encounter for general adult medical examination without abnormal findings: Secondary | ICD-10-CM | POA: Diagnosis not present

## 2015-05-27 DIAGNOSIS — Z131 Encounter for screening for diabetes mellitus: Secondary | ICD-10-CM

## 2015-05-27 DIAGNOSIS — K625 Hemorrhage of anus and rectum: Secondary | ICD-10-CM

## 2015-05-27 DIAGNOSIS — Z6832 Body mass index (BMI) 32.0-32.9, adult: Secondary | ICD-10-CM

## 2015-05-27 DIAGNOSIS — Z1329 Encounter for screening for other suspected endocrine disorder: Secondary | ICD-10-CM

## 2015-05-27 DIAGNOSIS — Z130 Encounter for screening for diseases of the blood and blood-forming organs and certain disorders involving the immune mechanism: Secondary | ICD-10-CM | POA: Diagnosis not present

## 2015-05-27 DIAGNOSIS — H53133 Sudden visual loss, bilateral: Secondary | ICD-10-CM

## 2015-05-27 DIAGNOSIS — R5382 Chronic fatigue, unspecified: Secondary | ICD-10-CM

## 2015-05-27 DIAGNOSIS — E559 Vitamin D deficiency, unspecified: Secondary | ICD-10-CM
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
## 2015-05-27 NOTE — Patient Instructions (Signed)
Hemorrhoids Hemorrhoids are swollen veins around the rectum or anus. There are two types of hemorrhoids:   Internal hemorrhoids. These occur in the veins just inside the rectum. They may poke through to the outside and become irritated and painful.  External hemorrhoids. These occur in the veins outside the anus and can be felt as a painful swelling or hard lump near the anus. CAUSES  Pregnancy.   Obesity.   Constipation or diarrhea.   Straining to have a bowel movement.   Sitting for long periods on the toilet.  Heavy lifting or other activity that caused you to strain.  Anal intercourse. SYMPTOMS   Pain.   Anal itching or irritation.   Rectal bleeding.   Fecal leakage.   Anal swelling.   One or more lumps around the anus.  DIAGNOSIS  Your caregiver may be able to diagnose hemorrhoids by visual examination. Other examinations or tests that may be performed include:   Examination of the rectal area with a gloved hand (digital rectal exam).   Examination of anal canal using a small tube (scope).   A blood test if you have lost a significant amount of blood.  A test to look inside the colon (sigmoidoscopy or colonoscopy). TREATMENT Most hemorrhoids can be treated at home. However, if symptoms do not seem to be getting better or if you have a lot of rectal bleeding, your caregiver may perform a procedure to help make the hemorrhoids get smaller or remove them completely. Possible treatments include:   Placing a rubber band at the base of the hemorrhoid to cut off the circulation (rubber band ligation).   Injecting a chemical to shrink the hemorrhoid (sclerotherapy).   Using a tool to burn the hemorrhoid (infrared light therapy).   Surgically removing the hemorrhoid (hemorrhoidectomy).   Stapling the hemorrhoid to block blood flow to the tissue (hemorrhoid stapling).  HOME CARE INSTRUCTIONS   Eat foods with fiber, such as whole grains, beans,  nuts, fruits, and vegetables. Ask your doctor about taking products with added fiber in them (fibersupplements).  Increase fluid intake. Drink enough water and fluids to keep your urine clear or pale yellow.   Exercise regularly.   Go to the bathroom when you have the urge to have a bowel movement. Do not wait.   Avoid straining to have bowel movements.   Keep the anal area dry and clean. Use wet toilet paper or moist towelettes after a bowel movement.   Medicated creams and suppositories may be used or applied as directed.   Only take over-the-counter or prescription medicines as directed by your caregiver.   Take warm sitz baths for 15-20 minutes, 3-4 times a day to ease pain and discomfort.   Place ice packs on the hemorrhoids if they are tender and swollen. Using ice packs between sitz baths may be helpful.   Put ice in a plastic bag.   Place a towel between your skin and the bag.   Leave the ice on for 15-20 minutes, 3-4 times a day.   Do not use a donut-shaped pillow or sit on the toilet for long periods. This increases blood pooling and pain.  SEEK MEDICAL CARE IF:  You have increasing pain and swelling that is not controlled by treatment or medicine.  You have uncontrolled bleeding.  You have difficulty or you are unable to have a bowel movement.  You have pain or inflammation outside the area of the hemorrhoids. MAKE SURE YOU:  Understand these instructions.    Will watch your condition.  Will get help right away if you are not doing well or get worse.   This information is not intended to replace advice given to you by your health care provider. Make sure you discuss any questions you have with your health care provider.   Document Released: 03/05/2000 Document Revised: 02/23/2012 Document Reviewed: 01/11/2012 Elsevier Interactive Patient Education [employer].  If you wan to talk about anxiety please make appt. To discuss. I would  recommend you see your eye doctor concerning your vision symptoms.  Please go to [facility] office to have blood drawn.  Complete fecal occult cards and follow directions on returning.  I will call you with results and we will proceed depending on their results.

## 2015-05-29 ENCOUNTER — Other Ambulatory Visit (INDEPENDENT_AMBULATORY_CARE_PROVIDER_SITE_OTHER): Payer: BLUE CROSS/BLUE SHIELD

## 2015-05-29 DIAGNOSIS — R5383 Other fatigue: Secondary | ICD-10-CM | POA: Diagnosis not present

## 2015-05-29 DIAGNOSIS — Z0000 Encounter for general adult medical examination without abnormal findings: Secondary | ICD-10-CM

## 2015-05-29 DIAGNOSIS — H53133 Sudden visual loss, bilateral: Secondary | ICD-10-CM

## 2015-05-29 DIAGNOSIS — Z131 Encounter for screening for diabetes mellitus: Secondary | ICD-10-CM

## 2015-05-29 DIAGNOSIS — G5793 Unspecified mononeuropathy of bilateral lower limbs: Secondary | ICD-10-CM

## 2015-05-29 DIAGNOSIS — Z130 Encounter for screening for diseases of the blood and blood-forming organs and certain disorders involving the immune mechanism: Secondary | ICD-10-CM | POA: Diagnosis not present

## 2015-05-29 DIAGNOSIS — Z6832 Body mass index (BMI) 32.0-32.9, adult: Secondary | ICD-10-CM

## 2015-05-29 DIAGNOSIS — Z1321 Encounter for screening for nutritional disorder: Secondary | ICD-10-CM

## 2015-05-29 DIAGNOSIS — E559 Vitamin D deficiency, unspecified: Secondary | ICD-10-CM | POA: Diagnosis not present

## 2015-05-29 DIAGNOSIS — R5382 Chronic fatigue, unspecified: Secondary | ICD-10-CM | POA: Diagnosis not present

## 2015-05-29 DIAGNOSIS — K625 Hemorrhage of anus and rectum: Secondary | ICD-10-CM | POA: Diagnosis not present

## 2015-05-29 DIAGNOSIS — Z13220 Encounter for screening for lipoid disorders: Secondary | ICD-10-CM | POA: Diagnosis not present

## 2015-05-29 DIAGNOSIS — Z1329 Encounter for screening for other suspected endocrine disorder: Secondary | ICD-10-CM

## 2015-05-29 LAB — CBC WITH DIFFERENTIAL/PLATELET
BASOS PCT: 0.3 % (ref 0.0–3.0)
Basophils Absolute: 0 10*3/uL (ref 0.0–0.1)
EOS PCT: 3.6 % (ref 0.0–5.0)
Eosinophils Absolute: 0.2 10*3/uL (ref 0.0–0.7)
HCT: 47.4 % (ref 39.0–52.0)
Hemoglobin: 16.1 g/dL (ref 13.0–17.0)
LYMPHS ABS: 1.2 10*3/uL (ref 0.7–4.0)
Lymphocytes Relative: 17.5 % (ref 12.0–46.0)
MCHC: 34 g/dL (ref 30.0–36.0)
MCV: 84.8 fl (ref 78.0–100.0)
MONO ABS: 0.7 10*3/uL (ref 0.1–1.0)
MONOS PCT: 9.6 % (ref 3.0–12.0)
NEUTROS ABS: 4.8 10*3/uL (ref 1.4–7.7)
NEUTROS PCT: 69 % (ref 43.0–77.0)
PLATELETS: 223 10*3/uL (ref 150.0–400.0)
RBC: 5.59 Mil/uL (ref 4.22–5.81)
RDW: 13.2 % (ref 11.5–15.5)
WBC: 6.9 10*3/uL (ref 4.0–10.5)

## 2015-05-29 LAB — LIPID PANEL
Cholesterol: 210 mg/dL — ABNORMAL HIGH (ref 0–200)
HDL: 37.9 mg/dL — AB (ref >39.00)
LDL CALC: 148 mg/dL — AB (ref 0–99)
NONHDL: 171.8
Total CHOL/HDL Ratio: 6
Triglycerides: 119 mg/dL (ref 0.0–149.0)
VLDL: 23.8 mg/dL (ref 0.0–40.0)

## 2015-05-29 LAB — COMPREHENSIVE METABOLIC PANEL
ALT: 25 U/L (ref 0–53)
AST: 19 U/L (ref 0–37)
Albumin: 4.4 g/dL (ref 3.5–5.2)
Alkaline Phosphatase: 52 U/L (ref 39–117)
BUN: 13 mg/dL (ref 6–23)
CHLORIDE: 104 meq/L (ref 96–112)
CO2: 29 mEq/L (ref 19–32)
Calcium: 9.5 mg/dL (ref 8.4–10.5)
Creatinine, Ser: 1.02 mg/dL (ref 0.40–1.50)
GFR: 82.74 mL/min (ref >60.00)
GLUCOSE: 86 mg/dL (ref 70–99)
POTASSIUM: 4.4 meq/L (ref 3.5–5.1)
SODIUM: 141 meq/L (ref 135–145)
Total Bilirubin: 0.5 mg/dL (ref 0.2–1.2)
Total Protein: 7.7 g/dL (ref 6.0–8.3)

## 2015-05-29 LAB — TSH: TSH: 1.39 u[IU]/mL (ref 0.35–4.50)

## 2015-05-29 LAB — TESTOSTERONE: Testosterone: 241.75 ng/dL — ABNORMAL LOW (ref 300.00–890.00)

## 2015-05-29 LAB — IRON AND TIBC
%SAT: 25 % (ref 15–60)
IRON: 76 ug/dL (ref 50–180)
TIBC: 310 ug/dL (ref 250–425)
UIBC: 234 ug/dL (ref 125–400)

## 2015-05-29 LAB — VITAMIN D 25 HYDROXY (VIT D DEFICIENCY, FRACTURES): VITD: 22.11 ng/mL — AB (ref 30.00–100.00)

## 2015-05-29 LAB — VITAMIN B12: VITAMIN B 12: 366 pg/mL (ref 211–911)

## 2015-05-29 LAB — SEDIMENTATION RATE: SED RATE: 15 mm/h (ref 0–22)

## 2015-05-29 LAB — HEMOGLOBIN A1C: HEMOGLOBIN A1C: 5.3 % (ref 4.6–6.5)

## 2015-05-29 LAB — PROTIME-INR
INR: 1.2 ratio — AB (ref 0.8–1.0)
Prothrombin Time: 12.5 s (ref 9.6–13.1)

## 2015-05-29 LAB — FERRITIN: FERRITIN: 216.6 ng/mL (ref 22.0–322.0)

## 2015-05-30 LAB — C-REACTIVE PROTEIN: CRP: 6.7 mg/L — ABNORMAL HIGH (ref 0.0–4.9)

## 2015-06-02 ENCOUNTER — Telehealth: Payer: Self-pay | Admitting: Family Medicine

## 2015-06-02 ENCOUNTER — Other Ambulatory Visit: Payer: Self-pay | Admitting: Family Medicine

## 2015-06-02 DIAGNOSIS — E349 Endocrine disorder, unspecified: Secondary | ICD-10-CM

## 2015-06-02 DIAGNOSIS — E230 Hypopituitarism: Secondary | ICD-10-CM | POA: Insufficient documentation

## 2015-06-02 DIAGNOSIS — R7989 Other specified abnormal findings of blood chemistry: Secondary | ICD-10-CM

## 2015-06-02 DIAGNOSIS — K625 Hemorrhage of anus and rectum: Secondary | ICD-10-CM

## 2015-06-02 DIAGNOSIS — E559 Vitamin D deficiency, unspecified: Secondary | ICD-10-CM | POA: Insufficient documentation

## 2015-06-02 MED ORDER — VITAMIN D (ERGOCALCIFEROL) 1.25 MG (50000 UNIT) PO CAPS: 50000.0000 [IU] | ORAL_CAPSULE | ORAL | Status: AC

## 2015-06-02 NOTE — Telephone Encounter (Signed)
Please call pt: - I have placed a referral to GI to have his rectal bleeding evaluated. Please still have him complete the FOBT cards.  - If he is taking any NSAIDS or ASA, please have him hold them.  - Cholesterol: Mildly elevated, should start fish oil supplement daily. This should improve the elevation in LDL (148) and also improve his HDL (37). We will recheck this in 1 year.  - Vit D low 22. Low/insufficient vitamin D causes muscle fatigue. Vitamin D supplement prescribed for 12 weeks, and then patient should be encouraged to take 800 international units, of over-the-counter vitamin D daily. -  B12 366 (lower normal). Pt should be encouraged to take an over-the-counter daily supplementation to B12. Patients with lowering B-12 can experience fatigue and no sensation of lower extremities. Which he had both. - Testosterone levels are mildly low at 241.75. In order to diagnose low testosterone, will need to repeat testosterone level as well as other hormone levels. Testosterone level was collected between 8 and 10 AM. - Inflammatory marker: CRP 6.7, elevated. This will be discussed further at his follow-up appointment.  - I would like patient to have testosterone, and other labs collected at his earliest convenience ([facility] ok), with a scheduled appointment to follow 3 days after lab collection, to discuss all laboratory results and further evaluation. Please strongly encourage patient to see his ophthalmologist as well for his recent visual loss, if he has not already. He was encouraged to this at his last appointment.

## 2015-06-03 ENCOUNTER — Other Ambulatory Visit (INDEPENDENT_AMBULATORY_CARE_PROVIDER_SITE_OTHER): Payer: BLUE CROSS/BLUE SHIELD

## 2015-06-03 DIAGNOSIS — E291 Testicular hypofunction: Secondary | ICD-10-CM

## 2015-06-03 LAB — FOLLICLE STIMULATING HORMONE: FSH: 16.8 m[IU]/mL (ref 1.4–18.1)

## 2015-06-03 LAB — TESTOSTERONE: TESTOSTERONE: 192.56 ng/dL — AB (ref 300.00–890.00)

## 2015-06-03 LAB — LUTEINIZING HORMONE: LH: 8.98 m[IU]/mL (ref 1.50–9.30)

## 2015-06-03 NOTE — Telephone Encounter (Signed)
Spoke with patient reviewed lab results and instructions with patient. Patient verbalized understanding of all instructions. 

## 2015-06-04 LAB — SEX HORMONE BINDING GLOBULIN: SEX HORMONE BINDING: 18 nmol/L (ref 10–50)

## 2015-06-05 ENCOUNTER — Telehealth: Payer: Self-pay | Admitting: Family Medicine

## 2015-06-05 ENCOUNTER — Telehealth: Payer: Self-pay | Admitting: *Deleted

## 2015-06-05 ENCOUNTER — Encounter: Payer: Self-pay | Admitting: Internal Medicine

## 2015-06-05 NOTE — Telephone Encounter (Signed)
Please call patient consented labs: - Please make patient aware I am still waiting on a part of his testosterone panel to result, currently appears it is leaning towards him having low testosterone. I would like to discuss this with him, including all of his other labs, and discuss potential therapy for low testosterone next week. Please have patient make an appointment.

## 2015-06-05 NOTE — Telephone Encounter (Signed)
Patient has appt tomorrow will review lab results at this time.

## 2015-06-06 ENCOUNTER — Ambulatory Visit (INDEPENDENT_AMBULATORY_CARE_PROVIDER_SITE_OTHER): Payer: BLUE CROSS/BLUE SHIELD | Admitting: Family Medicine

## 2015-06-06 ENCOUNTER — Encounter: Payer: Self-pay | Admitting: Family Medicine

## 2015-06-06 VITALS — BP 123/87 | HR 78 | Temp 98.4°F | Resp 20 | Wt 259.5 lb

## 2015-06-06 DIAGNOSIS — G5793 Unspecified mononeuropathy of bilateral lower limbs: Secondary | ICD-10-CM | POA: Diagnosis not present

## 2015-06-06 DIAGNOSIS — H53133 Sudden visual loss, bilateral: Secondary | ICD-10-CM

## 2015-06-06 DIAGNOSIS — R7989 Other specified abnormal findings of blood chemistry: Secondary | ICD-10-CM

## 2015-06-06 DIAGNOSIS — C61 Malignant neoplasm of prostate: Secondary | ICD-10-CM | POA: Diagnosis not present

## 2015-06-06 DIAGNOSIS — K625 Hemorrhage of anus and rectum: Secondary | ICD-10-CM

## 2015-06-06 DIAGNOSIS — R198 Other specified symptoms and signs involving the digestive system and abdomen: Secondary | ICD-10-CM

## 2015-06-06 DIAGNOSIS — R200 Anesthesia of skin: Secondary | ICD-10-CM

## 2015-06-06 DIAGNOSIS — E349 Endocrine disorder, unspecified: Secondary | ICD-10-CM

## 2015-06-06 DIAGNOSIS — K639 Disease of intestine, unspecified: Secondary | ICD-10-CM

## 2015-06-06 DIAGNOSIS — R5382 Chronic fatigue, unspecified: Secondary | ICD-10-CM

## 2015-06-06 DIAGNOSIS — Z125 Encounter for screening for malignant neoplasm of prostate: Secondary | ICD-10-CM | POA: Diagnosis not present

## 2015-06-06 DIAGNOSIS — R202 Paresthesia of skin: Secondary | ICD-10-CM

## 2015-06-06 DIAGNOSIS — E559 Vitamin D deficiency, unspecified: Secondary | ICD-10-CM

## 2015-06-06 DIAGNOSIS — E291 Testicular hypofunction: Secondary | ICD-10-CM | POA: Diagnosis not present

## 2015-06-06 LAB — TESTOSTERONE, FREE AND TOTAL (INCLUDES SHBG)-(MALES)
Sex Hormone Binding: 18 nmol/L (ref 10–50)
Testosterone, Free: 38.6 pg/mL — ABNORMAL LOW (ref 47.0–244.0)
Testosterone-% Free: 2.5 % (ref 1.6–2.9)
Testosterone: 152 ng/dL — ABNORMAL LOW (ref 250–827)

## 2015-06-06 LAB — PROLACTIN: PROLACTIN: 6.8 ng/mL (ref 2.0–18.0)

## 2015-06-06 LAB — PSA: PSA: 0.83 ng/mL (ref 0.10–4.00)

## 2015-06-06 LAB — MAGNESIUM: MAGNESIUM: 2 mg/dL (ref 1.5–2.5)

## 2015-06-06 MED ORDER — HYDROCORTISONE ACETATE 25 MG RE SUPP: 25.0000 mg | Freq: Two times a day (BID) | RECTAL | Status: DC

## 2015-06-06 MED ORDER — ALIGN 4 MG PO CAPS: ORAL_CAPSULE | ORAL | Status: AC

## 2015-06-06 MED ORDER — TESTOSTERONE CYPIONATE 200 MG/ML IM SOLN: 200.0000 mg | INTRAMUSCULAR | Status: AC
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
## 2015-06-06 NOTE — Patient Instructions (Signed)
I will call you once we receive approval for injections to begin. Injections are every 2 weeks. Retest in 6 weeks and then 3 mos for PSA and testosterone, if normal test every 6 - 12 months. Continue Vit D and B12 for 12 weeks.  If labs today are normal, and you are not seeing improvement in bilateral feet sensation, we will order a study to check the blood flow in your feet and consider sports medicine referral. Start suppositories for x14 doses (7 days). Use miralax (taper to BM) to have soft stools, but not diarrhea.    COMPLETE STOOL CARDS--> if not, well you now what is next.     Intermittent Claudication Intermittent claudication is pain in your leg that occurs when you walk or exercise and goes away when you rest. The pain can occur in one or both legs. CAUSES Intermittent claudication is caused by the buildup of plaque within the major arteries in the body (atherosclerosis). The plaque, which makes arteries stiff and narrow, prevents enough blood from reaching your leg muscles. The pain occurs when you walk or exercise because your muscles need more blood when you are moving and exercising. RISK FACTORS Risk factors include:  A family history of atherosclerosis.  A personal history of stroke or heart disease.  Older age.  Being inactive or overweight.  Smoking cigarettes.  Having another health condition such as:  Diabetes.  High blood pressure.  High cholesterol. SIGNS AND SYMPTOMS  Your hip or leg may:   Ache.  Cramp.  Feel tight.  Feel weak.  Feel heavy. Over time, you may feel pain in your calf, thigh, or hip. DIAGNOSIS  Your health care provider may diagnose intermittent claudication based on your symptoms and medical history. Your health care provider may also do tests to learn more about your condition. These may include:  Blood tests.  An ultrasound.  Imaging tests such as angiography, magnetic resonance angiography (MRA), and computed tomography  angiography (CTA). TREATMENT You may be treated for problems such as:  High blood pressure.  High cholesterol.  Diabetes. Other treatments may include:  Lifestyle changes such as:  Starting an exercise program.  Losing weight.  Quitting smoking.  Medicines to help restore blood flow through your legs.  Blood vessel surgery (angioplasty) to restore blood flow if your intermittent claudication is caused by severe peripheral artery disease. HOME CARE INSTRUCTIONS  Manage any other health conditions you have.  Eat a diet low in saturated fats and calories to maintain a healthy weight.  Quit smoking, if you smoke.  Take medicines only as directed by your health care provider.  If your health care provider recommended an exercise program for you, follow it as directed. Your exercise program may involve:  Walking three or more times a week.  Walking until you have certain symptoms of intermittent claudication.  Resting until symptoms go away.  Gradually increasing walking time to about 50 minutes a day. SEEK MEDICAL CARE IF: Your condition is not getting better or is getting worse. SEEK IMMEDIATE MEDICAL CARE IF:   You have chest pain.  You have difficulty breathing.  You develop arm weakness.  You have trouble speaking.  Your face begins to droop. MAKE SURE YOU:  Understand these instructions.  Will watch your condition.  Will get help if you are not doing well or get worse.   This information is not intended to replace advice given to you by your health care provider. Make sure you discuss any  questions you have with your health care provider.   Document Released: 01/09/2004 Document Revised: 03/29/2014 Document Reviewed: 06/14/2013 Elsevier Interactive Patient Education 2016 [employer].   Testosterone injection What is this medicine? TESTOSTERONE (tes TOS ter one) is the main male hormone. It supports normal male development such as muscle growth,  facial hair, and deep voice. It is used in males to treat low testosterone levels. This medicine may be used for other purposes; ask your health care provider or pharmacist if you have questions. What should I tell my health care provider before I take this medicine? They need to know if you have any of these conditions: -breast cancer -diabetes -heart disease -kidney disease -liver disease -lung disease -prostate cancer, enlargement -an unusual or allergic reaction to testosterone, other medicines, foods, dyes, or preservatives -pregnant or trying to get pregnant -breast-feeding How should I use this medicine? This medicine is for injection into a muscle. It is usually given by a health care professional in a hospital or clinic setting. Contact your pediatrician regarding the use of this medicine in children. While this medicine may be prescribed for children as young as 48 years of age for selected conditions, precautions do apply. Overdosage: If you think you have taken too much of this medicine contact a poison control center or emergency room at once. NOTE: This medicine is only for you. Do not share this medicine with others. What if I miss a dose? Try not to miss a dose. Your doctor or health care professional will tell you when your next injection is due. Notify the office if you are unable to keep an appointment. What may interact with this medicine? -medicines for diabetes -medicines that treat or prevent blood clots like warfarin -oxyphenbutazone -propranolol -steroid medicines like prednisone or cortisone This list may not describe all possible interactions. Give your health care provider a list of all the medicines, herbs, non-prescription drugs, or dietary supplements you use. Also tell them if you smoke, drink alcohol, or use illegal drugs. Some items may interact with your medicine. What should I watch for while using this medicine? Visit your doctor or health care  professional for regular checks on your progress. They will need to check the level of testosterone in your blood. This medicine is only approved for use in men who have low levels of testosterone related to certain medical conditions. Heart attacks and strokes have been reported with the use of this medicine. Notify your doctor or health care professional and seek emergency treatment if you develop breathing problems; changes in vision; confusion; chest pain or chest tightness; sudden arm pain; severe, sudden headache; trouble speaking or understanding; sudden numbness or weakness of the face, arm or leg; loss of balance or coordination. Talk to your doctor about the risks and benefits of this medicine. This medicine may affect blood sugar levels. If you have diabetes, check with your doctor or health care professional before you change your diet or the dose of your diabetic medicine. This drug is banned from use in athletes by most athletic organizations. What side effects may I notice from receiving this medicine? Side effects that you should report to your doctor or health care professional as soon as possible: -allergic reactions like skin rash, itching or hives, swelling of the face, lips, or tongue -breast enlargement -breathing problems -changes in mood, especially anger, depression, or rage -dark urine -general ill feeling or flu-like symptoms -light-colored stools -loss of appetite, nausea -nausea, vomiting -right upper belly pain -  stomach pain -swelling of ankles -too frequent or persistent erections -trouble passing urine or change in the amount of urine -unusually weak or tired -yellowing of the eyes or skin Additional side effects that can occur in women include: -deep or hoarse voice -facial hair growth -irregular menstrual periods Side effects that usually do not require medical attention (report to your doctor or health care professional if they continue or are  bothersome): -acne -change in sex drive or performance -hair loss -headache This list may not describe all possible side effects. Call your doctor for medical advice about side effects. You may report side effects to FDA at 1-800-FDA-1088. Where should I keep my medicine? Keep out of the reach of children. This medicine can be abused. Keep your medicine in a safe place to protect it from theft. Do not share this medicine with anyone. Selling or giving away this medicine is dangerous and against the law. Store at room temperature between 20 and 25 degrees C (68 and 77 degrees F). Do not freeze. Protect from light. Follow the directions for the product you are prescribed. Throw away any unused medicine after the expiration date. NOTE: This sheet is a summary. It may not cover all possible information. If you have questions about this medicine, talk to your doctor, pharmacist, or health care provider.    2016, Elsevier/Gold Standard. (2013-05-24 [id number])

## 2015-06-07 LAB — ANA: ANA: NEGATIVE

## 2015-06-09 ENCOUNTER — Telehealth: Payer: Self-pay | Admitting: Family Medicine

## 2015-06-09 NOTE — Telephone Encounter (Signed)
Please call pt: - all labs are normal, (PSA, magnesium and autoimmune marker). - I have called in his testosterone prescript, however, please explain to him this will take a prior authorization form to be filled by [place]. Once it is approved and he gets his prescript it will be through his pharmacy, and then he will bring the entire script to his first nurse appt, and we will store for him.

## 2015-06-09 NOTE — Telephone Encounter (Signed)
Spoke with patient reviewed results and instructions . Patient verbalized understanding. 

## 2015-06-11 ENCOUNTER — Other Ambulatory Visit: Payer: BLUE CROSS/BLUE SHIELD

## 2015-06-12 ENCOUNTER — Encounter: Payer: Self-pay | Admitting: Family Medicine

## 2015-06-12 ENCOUNTER — Other Ambulatory Visit: Payer: BLUE CROSS/BLUE SHIELD

## 2015-06-12 ENCOUNTER — Telehealth: Payer: Self-pay | Admitting: Family Medicine

## 2015-06-12 DIAGNOSIS — E230 Hypopituitarism: Secondary | ICD-10-CM

## 2015-06-12 DIAGNOSIS — E559 Vitamin D deficiency, unspecified: Secondary | ICD-10-CM

## 2015-06-12 NOTE — Telephone Encounter (Signed)
Spoke with patient reviewed instructions. Patient will call back to set up nurse visit for testosterone injections.

## 2015-06-12 NOTE — Telephone Encounter (Signed)
Please call pt: - His testosterone PA has been approved.  - His pharmacy will be filling script and will need to schedule nurse visit for injections every 2 weeks. He will pick up script and bring to office for storage.  - testosterone follow up: F/u appt 7 weeks, with morning testosterone collected 5-7 days prior (btw 8-10am). Order placed. If no improvement on feet sensation with vitamin/test supplement at that time will move forward with further evaluation.

## 2015-06-17 ENCOUNTER — Ambulatory Visit (INDEPENDENT_AMBULATORY_CARE_PROVIDER_SITE_OTHER): Payer: BLUE CROSS/BLUE SHIELD | Admitting: *Deleted

## 2015-06-17 ENCOUNTER — Other Ambulatory Visit: Payer: BLUE CROSS/BLUE SHIELD

## 2015-06-17 ENCOUNTER — Telehealth: Payer: Self-pay | Admitting: *Deleted

## 2015-06-17 DIAGNOSIS — E291 Testicular hypofunction: Secondary | ICD-10-CM | POA: Diagnosis not present

## 2015-06-17 DIAGNOSIS — R7989 Other specified abnormal findings of blood chemistry: Secondary | ICD-10-CM

## 2015-06-17 DIAGNOSIS — K921 Melena: Secondary | ICD-10-CM

## 2015-06-17 MED ORDER — TESTOSTERONE CYPIONATE 200 MG/ML IM SOLN
200.0000 mg | Freq: Once | INTRAMUSCULAR | Status: AC
  Administered 2015-06-17: 200 mg via INTRAMUSCULAR

## 2015-06-17 NOTE — Progress Notes (Signed)
Patient presents today for first testosterone injection. Patient tolerated injection well. Next injection due in two weeks.

## 2015-06-17 NOTE — Telephone Encounter (Signed)
[facility] called need order to process patient hemoccult cards. Order placed

## 2015-06-18 LAB — HEMOCCULT SLIDES (X 3 CARDS)
Fecal Occult Blood: NEGATIVE
OCCULT 1: NEGATIVE
OCCULT 2: NEGATIVE
OCCULT 3: NEGATIVE
OCCULT 4: NEGATIVE
OCCULT 5: NEGATIVE

## 2015-06-19 ENCOUNTER — Telehealth: Payer: Self-pay | Admitting: Family Medicine

## 2015-06-19 NOTE — Telephone Encounter (Signed)
Left message with patient results on patient voice mail.

## 2015-06-19 NOTE — Telephone Encounter (Signed)
Please call pt: - His fecal occult cards are negative.

## 2015-07-01 ENCOUNTER — Ambulatory Visit (INDEPENDENT_AMBULATORY_CARE_PROVIDER_SITE_OTHER): Payer: BLUE CROSS/BLUE SHIELD | Admitting: *Deleted

## 2015-07-01 DIAGNOSIS — E291 Testicular hypofunction: Secondary | ICD-10-CM | POA: Diagnosis not present

## 2015-07-01 DIAGNOSIS — R7989 Other specified abnormal findings of blood chemistry: Secondary | ICD-10-CM

## 2015-07-01 MED ORDER — TESTOSTERONE CYPIONATE 200 MG/ML IM SOLN
200.0000 mg | Freq: Once | INTRAMUSCULAR | Status: AC
  Administered 2015-07-01: 200 mg via INTRAMUSCULAR

## 2015-07-01 NOTE — Progress Notes (Signed)
Patient presents today for testosterone injection per Dr Raoul Pitch orders. Patient tolerated injection well. Next injection in 2 weeks.

## 2015-07-15 ENCOUNTER — Ambulatory Visit (INDEPENDENT_AMBULATORY_CARE_PROVIDER_SITE_OTHER): Payer: BLUE CROSS/BLUE SHIELD | Admitting: *Deleted

## 2015-07-15 DIAGNOSIS — R7989 Other specified abnormal findings of blood chemistry: Secondary | ICD-10-CM

## 2015-07-15 DIAGNOSIS — E291 Testicular hypofunction: Secondary | ICD-10-CM

## 2015-07-15 MED ORDER — TESTOSTERONE CYPIONATE 200 MG/ML IM SOLN
200.0000 mg | Freq: Once | INTRAMUSCULAR | Status: AC
  Administered 2015-07-15: 200 mg via INTRAMUSCULAR

## 2015-07-15 NOTE — Progress Notes (Signed)
   Subjective:    Patient ID: Andrew Peck, male    DOB: [birth year], 50 y.o.   MRN: [id number]  HPI    Review of Systems     Objective:   Physical Exam        Assessment & Plan:

## 2015-07-15 NOTE — Progress Notes (Signed)
Patient presents today for testosterone injection. Patient tolerated injection well. Next injection in 2 weeks.

## 2015-07-25 ENCOUNTER — Encounter: Payer: Self-pay | Admitting: Gastroenterology

## 2015-07-28 ENCOUNTER — Other Ambulatory Visit: Payer: Self-pay

## 2015-07-28 ENCOUNTER — Encounter: Payer: Self-pay | Admitting: Internal Medicine

## 2015-07-28 ENCOUNTER — Ambulatory Visit (INDEPENDENT_AMBULATORY_CARE_PROVIDER_SITE_OTHER): Payer: BLUE CROSS/BLUE SHIELD | Admitting: Internal Medicine

## 2015-07-28 VITALS — BP 146/92 | HR 92 | Ht 75.0 in | Wt 271.0 lb

## 2015-07-28 DIAGNOSIS — K625 Hemorrhage of anus and rectum: Secondary | ICD-10-CM

## 2015-07-28 DIAGNOSIS — K648 Other hemorrhoids: Secondary | ICD-10-CM

## 2015-07-28 DIAGNOSIS — K5900 Constipation, unspecified: Secondary | ICD-10-CM

## 2015-07-28 DIAGNOSIS — K5909 Other constipation: Secondary | ICD-10-CM

## 2015-07-28 NOTE — Progress Notes (Signed)
Referred by Howard Pouch, DO Subjective:    Patient ID: Andrew Peck, male    DOB: 08-20-1965, 50 y.o.   MRN: [id number] Cc: increasing rectal bleeding, constipation HPI  Andrew Peck is a middle-aged white man with a 7 + yr hx constipation and rectal bleeding. Over past few months rectal bleeding - always bright red - has increased in frequency and amount. Into bowl. No anal or rectal pain. Has stool q 1-1.5 d and strains. Has tried benefiber daily - no help. Yrs ago stimulant laxatives. Always feels bloated, heavy in abdomen. No priorendosopic evaluation. Recent trial occasional MiraLax. No Known Allergies Outpatient Prescriptions Prior to Visit  Medication Sig Dispense Refill  . testosterone cypionate (DEPOTESTOSTERONE CYPIONATE) 200 MG/ML injection Inject 1 mL (200 mg total) into the muscle every 14 (fourteen) days. 10 mL 1  . Vitamin D, Ergocalciferol, (DRISDOL) 50000 units CAPS capsule Take 1 capsule (50,000 Units total) by mouth every 7 (seven) days. 12 capsule 0  . Probiotic Product (ALIGN) 4 MG CAPS 1 tab daily for 8 weeks. (Patient not taking: Reported on 07/28/2015) 60 capsule 0  . azithromycin (AZASITE) 1 % ophthalmic solution Place 1 drop into both eyes 2 (two) times daily. Reported on 07/28/2015    . hydrocortisone (ANUSOL-HC) 25 MG suppository Place 1 suppository (25 mg total) rectally 2 (two) times daily. (Patient not taking: Reported on 07/28/2015) 14 suppository 0   No facility-administered medications prior to visit.   Past Medical History  Diagnosis Date  . Vitamin D deficiency   . Lymphadenitis   . Hyperlipidemia   . Rectal bleeding   . Agoraphobia with panic attacks   . Testosterone deficiency    Past Surgical History  Procedure Laterality Date  . No past surgeries     Social History   Social History  . Marital Status: Married    Spouse Name: N/A  . Number of Children: N/A  . Years of Education: N/A   Social History Main Topics  . Smoking status: Never  Smoker   . Smokeless tobacco: Never Used  . Alcohol Use: 0.0 oz/week    0 Standard drinks or equivalent per week  . Drug Use: No  . Sexual Activity: Yes    Birth Control/ Protection: Condom   Other Topics Concern  . None   Social History Narrative   Divorced, significant other Caren Griffins.   1 daughter, 2 sons   Forensic psychologist, Customer service manager.   Drinks caffeine   Wear seatbelts and bicycle helmet   Exercises at least 3 times a week   Smoking detector in the home, firearms in the home   Feels safe in his relationships.   Family History  Problem Relation Age of Onset  . Non-Hodgkin's lymphoma Father   . Hypertension Father   . Lung cancer Paternal Grandmother   . Diabetes Mellitus II Mother   . Hyperlipidemia Mother   . Stroke Maternal Grandfather   . Stroke Paternal Grandfather          Review of Systems Numbness tingling in feet - some purple discoloration and cool feeling - started after long backpacking trip - if he runs the sxs intensify and radiate up legs Not nocturnal Starting testosterone Tx All other ROS negative    Objective:   Physical Exam @BP  146/92 mmHg  Pulse 92  Ht 6\' 3"  (1.905 m)  Wt 271 lb (122.925 kg)  BMI 33.87 kg/m2@  General:  Well-developed, well-nourished and in no acute distress Eyes:  anicteric. ENT:  Mouth and posterior pharynx free of lesions.  Neck:   supple w/o thyromegaly or mass.  Lungs: Clear to auscultation bilaterally. Heart:  S1S2, no rubs, murmurs, gallops. Abdomen:  soft, non-tender, no hepatosplenomegaly, hernia, or mass and BS+.  Rectal: Slight bulge at anal area  No mass, brown stool, NL prostate   Anoscopy was performed with the patient in the left lateral decubitus position and revealed Gr 2 internal hemorrhoids, inflamed, all positions    Lymph:  no cervical or supraclavicular adenopathy. Extremities:   no edema, cyanosis or clubbing Skin   no rash. Neuro:  A&O x 3.  Psych:  appropriate mood and  Affect.   Data  Reviewed:  Lab Results  Component Value Date   WBC 6.9 05/29/2015   HGB 16.1 05/29/2015   HCT 47.4 05/29/2015   MCV 84.8 05/29/2015   PLT 223.0 05/29/2015   PCP notes 2017     Assessment & Plan:   Encounter Diagnoses  Name Primary?  . Rectal bleeding - increased Yes  . Chronic constipation   . Hemorrhoids, internal, with bleeding     Colonoscopy to evaluate increase in bleeding - ? Colorectal neoplasia in addition to bleeding hemorrhoids Daily MiraLax to treat constipation ? Purge w/ MiraLax - he may try this ? Banding later to fix hemorrhoids but will try to improve bowel habits first  The risks and benefits as well as alternatives of endoscopic procedure(s) have been discussed and reviewed. All questions answered. The patient agrees to proceed.  I appreciate the opportunity to care for this patient. [id number] Kuneff, DO

## 2015-07-28 NOTE — Patient Instructions (Signed)
  You have been scheduled for a colonoscopy. Please follow written instructions given to you at your visit today.  Please pick up your prep supplies at the pharmacy. If you use inhalers (even only as needed), please bring them with you on the day of your procedure.    Use 1-2 doses of Miralax daily.    Use the miralax purge if needed, it is as follows:  Dr Carlean Purl recommends that you complete a bowel purge (to clean out your bowels). Please do the following: Purchase a bottle of Miralax over the counter as well as a box of 5 mg dulcolax tablets. Take 4 dulcolax tablets. Wait 1 hour. You will then drink 6-8 capfuls of Miralax mixed in an adequate amount of water/juice/gatorade (you may choose which of these liquids to drink) over the next 2-3 hours. You should expect results within 1 to 6 hours after completing the bowel purge.    I appreciate the opportunity to care for you.

## 2015-07-29 ENCOUNTER — Ambulatory Visit (INDEPENDENT_AMBULATORY_CARE_PROVIDER_SITE_OTHER): Payer: BLUE CROSS/BLUE SHIELD | Admitting: *Deleted

## 2015-07-29 ENCOUNTER — Encounter: Payer: Self-pay | Admitting: Internal Medicine

## 2015-07-29 DIAGNOSIS — E291 Testicular hypofunction: Secondary | ICD-10-CM | POA: Diagnosis not present

## 2015-07-29 DIAGNOSIS — R7989 Other specified abnormal findings of blood chemistry: Secondary | ICD-10-CM

## 2015-07-29 MED ORDER — TESTOSTERONE CYPIONATE 200 MG/ML IM SOLN
200.0000 mg | Freq: Once | INTRAMUSCULAR | Status: AC
  Administered 2015-07-29: 200 mg via INTRAMUSCULAR

## 2015-07-29 NOTE — Progress Notes (Signed)
Patient presents today for testosterone injection per Dr Raoul Pitch. Patient tolerated injection well.

## 2015-08-06 ENCOUNTER — Ambulatory Visit (AMBULATORY_SURGERY_CENTER): Payer: BLUE CROSS/BLUE SHIELD | Admitting: Internal Medicine

## 2015-08-06 ENCOUNTER — Encounter: Payer: Self-pay | Admitting: Internal Medicine

## 2015-08-06 VITALS — BP 119/74 | HR 79 | Temp 98.9°F | Resp 11 | Ht 75.0 in | Wt 271.0 lb

## 2015-08-06 DIAGNOSIS — K649 Unspecified hemorrhoids: Secondary | ICD-10-CM

## 2015-08-06 DIAGNOSIS — K625 Hemorrhage of anus and rectum: Secondary | ICD-10-CM

## 2015-08-06 MED ORDER — SODIUM CHLORIDE 0.9 % IV SOLN: 500.0000 mL | INTRAVENOUS | Status: DC

## 2015-08-06 NOTE — Progress Notes (Signed)
No problems noted in the recovery room. maw 

## 2015-08-06 NOTE — Op Note (Signed)
[facility] Patient Name: Andrew Peck Procedure Date: 08/06/2015 3:02 PM MRN: [id number] Endoscopist: Gatha Mayer , MD Age: 50 Referring MD:  Date of Birth: 1965-09-12 Gender: Male Procedure:                Colonoscopy Indications:              Evaluation of unexplained GI bleeding, This is the                            patient's first colonoscopy Medicines:                Propofol per Anesthesia, Monitored Anesthesia Care Procedure:                Pre-Anesthesia Assessment:                           - Prior to the procedure, a History and Physical                            was performed, and patient medications and                            allergies were reviewed. The patient's tolerance of                            previous anesthesia was also reviewed. The risks                            and benefits of the procedure and the sedation                            options and risks were discussed with the patient.                            All questions were answered, and informed consent                            was obtained. Prior Anticoagulants: The patient has                            taken no previous anticoagulant or antiplatelet                            agents. ASA Grade Assessment: II - A patient with                            mild systemic disease. After reviewing the risks                            and benefits, the patient was deemed in                            satisfactory condition to undergo the procedure.  After obtaining informed consent, the colonoscope                            was passed under direct vision. Throughout the                            procedure, the patient's blood pressure, pulse, and                            oxygen saturations were monitored continuously. The                            Model CF-HQ190L [id number]) scope was introduced                            through the anus and advanced  to the the cecum,                            identified by appendiceal orifice and ileocecal                            valve. The colonoscopy was performed without                            difficulty. The patient tolerated the procedure                            well. The quality of the bowel preparation was                            excellent. The bowel preparation used was Miralax.                            The ileocecal valve, appendiceal orifice, and                            rectum were photographed. Scope In: 3:07:02 PM Scope Out: 3:18:20 PM Scope Withdrawal Time: 0 hours 9 minutes 36 seconds  Total Procedure Duration: 0 hours 11 minutes 18 seconds  Findings:                 The perianal and digital rectal examinations were                            normal. Pertinent negatives include normal prostate                            (size, shape, and consistency).                           Internal hemorrhoids were found during retroflexion.                           A few small and large-mouthed diverticula were  found in the left colon. There was no evidence of                            diverticular bleeding.                           The exam was otherwise without abnormality on                            direct and retroflexion views. Complications:            No immediate complications. Estimated Blood Loss:     Estimated blood loss: none. Impression:               - Internal hemorrhoids.                           - Mild diverticulosis in the left colon. There was                            no evidence of diverticular bleeding.                           - The examination was otherwise normal on direct                            and retroflexion views.                           - No specimens collected. Recommendation:           - Patient has a contact number available for                            emergencies. The signs and symptoms of potential                             delayed complications were discussed with the                            patient. Return to normal activities tomorrow.                            Written discharge instructions were provided to the                            patient.                           - Resume previous diet.                           - Continue present medications.                           - Repeat colonoscopy in 10 years for screening  purposes.                           - Use regular MiraLax regimen to treat constipation.                           If persistent hemorrhoid bleeding despite that then                            can band. Gatha Mayer, MD 08/06/2015 3:32:48 PM This report has been signed electronically.

## 2015-08-06 NOTE — Patient Instructions (Addendum)
No polyps or cancer. Hemorrhoids as we thought and rare diverticulosis - not usually a problem.  Try using daily MiraLax or a regular regimen of that to empty bowels better. If persistent hemorrhoid bleeding I can band them to stop that.  Next routine colonoscopy in 10 years - 2027  I appreciate the opportunity to care for you. Gatha Mayer, MD, FACG    YOU HAD AN ENDOSCOPIC PROCEDURE TODAY AT [facility]:   Refer to the procedure report that was given to you for any specific questions about what was found during the examination.  If the procedure report does not answer your questions, please call your gastroenterologist to clarify.  If you requested that your care partner not be given the details of your procedure findings, then the procedure report has been included in a sealed envelope for you to review at your convenience later.  YOU SHOULD EXPECT: Some feelings of bloating in the abdomen. Passage of more gas than usual.  Walking can help get rid of the air that was put into your GI tract during the procedure and reduce the bloating. If you had a lower endoscopy (such as a colonoscopy or flexible sigmoidoscopy) you may notice spotting of blood in your stool or on the toilet paper. If you underwent a bowel prep for your procedure, you may not have a normal bowel movement for a few days.  Please Note:  You might notice some irritation and congestion in your nose or some drainage.  This is from the oxygen used during your procedure.  There is no need for concern and it should clear up in a day or so.  SYMPTOMS TO REPORT IMMEDIATELY:   Following lower endoscopy (colonoscopy or flexible sigmoidoscopy):  Excessive amounts of blood in the stool  Significant tenderness or worsening of abdominal pains  Swelling of the abdomen that is new, acute  Fever of 100F or higher   For urgent or emergent issues, a gastroenterologist can be reached at any hour by calling  [phone].   DIET: Your first meal following the procedure should be a small meal and then it is ok to progress to your normal diet. Heavy or fried foods are harder to digest and may make you feel nauseous or bloated.  Likewise, meals heavy in dairy and vegetables can increase bloating.  Drink plenty of fluids but you should avoid alcoholic beverages for 24 hours.  ACTIVITY:  You should plan to take it easy for the rest of today and you should NOT DRIVE or use heavy machinery until tomorrow (because of the sedation medicines used during the test).    FOLLOW UP: Our staff will call the number listed on your records the next business day following your procedure to check on you and address any questions or concerns that you may have regarding the information given to you following your procedure. If we do not reach you, we will leave a message.  However, if you are feeling well and you are not experiencing any problems, there is no need to return our call.  We will assume that you have returned to your regular daily activities without incident.  If any biopsies were taken you will be contacted by phone or by letter within the next 1-3 weeks.  Please call us at [phone] if you have not heard about the biopsies in 3 weeks.    SIGNATURES/CONFIDENTIALITY: You and/or your care partner have signed paperwork which will be entered  into your electronic medical record.  These signatures attest to the fact that that the information above on your After Visit Summary has been reviewed and is understood.  Full responsibility of the confidentiality of this discharge information lies with you and/or your care-partner.    Handouts were given to your care partner on hemorrhoids and diverticulosis. You may resume your current medications today. Please call if any questions or concerns.

## 2015-08-06 NOTE — Progress Notes (Signed)
A/ox3, pleased with MAC, report to RN 

## 2015-08-07 ENCOUNTER — Telehealth: Payer: Self-pay

## 2015-08-07 NOTE — Telephone Encounter (Signed)
  Follow up Call-  Call back number 08/06/2015  Post procedure Call Back phone  # [phone]  Permission to leave phone message Yes     Patient questions:  Do you have a fever, pain , or abdominal swelling? No. Pain Score  0 *  Have you tolerated food without any problems? Yes.    Have you been able to return to your normal activities? Yes.    Do you have any questions about your discharge instructions: Diet   No. Medications  No. Follow up visit  No.  Do you have questions or concerns about your Care? No.  Actions: * If pain score is 4 or above: No action needed, pain <4.

## 2015-08-08 ENCOUNTER — Other Ambulatory Visit: Payer: BLUE CROSS/BLUE SHIELD

## 2015-08-08 ENCOUNTER — Other Ambulatory Visit: Payer: Self-pay | Admitting: Family Medicine

## 2015-08-08 DIAGNOSIS — R7989 Other specified abnormal findings of blood chemistry: Secondary | ICD-10-CM

## 2015-08-09 LAB — TESTOSTERONE: TESTOSTERONE: 391 ng/dL (ref 250–827)

## 2015-08-11 ENCOUNTER — Ambulatory Visit: Payer: BLUE CROSS/BLUE SHIELD | Admitting: Family Medicine

## 2015-08-12 LAB — CP2130 TESTOSTERONE, TOTAL AND FREE
SEX HORMONE BINDING: 23 nmol/L (ref 10–50)
TESTOSTERONE FREE: 96 pg/mL (ref 47.0–244.0)
TESTOSTERONE: 391 ng/dL (ref 300–890)
Testosterone-% Free: 2.5 % (ref 1.6–2.9)

## 2015-08-19 ENCOUNTER — Ambulatory Visit (INDEPENDENT_AMBULATORY_CARE_PROVIDER_SITE_OTHER): Payer: BLUE CROSS/BLUE SHIELD | Admitting: Family Medicine

## 2015-08-19 ENCOUNTER — Encounter: Payer: Self-pay | Admitting: Family Medicine

## 2015-08-19 DIAGNOSIS — G5793 Unspecified mononeuropathy of bilateral lower limbs: Secondary | ICD-10-CM

## 2015-08-19 DIAGNOSIS — E291 Testicular hypofunction: Secondary | ICD-10-CM

## 2015-08-19 DIAGNOSIS — E559 Vitamin D deficiency, unspecified: Secondary | ICD-10-CM

## 2015-08-19 DIAGNOSIS — R202 Paresthesia of skin: Secondary | ICD-10-CM | POA: Diagnosis not present

## 2015-08-19 DIAGNOSIS — K648 Other hemorrhoids: Secondary | ICD-10-CM

## 2015-08-19 DIAGNOSIS — E230 Hypopituitarism: Secondary | ICD-10-CM

## 2015-08-19 DIAGNOSIS — R200 Anesthesia of skin: Secondary | ICD-10-CM

## 2015-08-19 DIAGNOSIS — E785 Hyperlipidemia, unspecified: Secondary | ICD-10-CM

## 2015-08-19 MED ORDER — TESTOSTERONE CYPIONATE 200 MG/ML IM SOLN
200.0000 mg | Freq: Once | INTRAMUSCULAR | Status: AC
  Administered 2015-08-19: 200 mg via INTRAMUSCULAR

## 2015-08-19 NOTE — Patient Instructions (Signed)
Get xray as soon as possible.  Testosterone levels look good, glad you are feeling better.  Continue every 2 week injections.  Retest vitamin D in 2 weeks. Lab ordered.  Retest testosterone in 6 months.
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
## 2015-09-02 ENCOUNTER — Ambulatory Visit: Payer: BLUE CROSS/BLUE SHIELD

## 2015-09-08 ENCOUNTER — Other Ambulatory Visit (INDEPENDENT_AMBULATORY_CARE_PROVIDER_SITE_OTHER): Payer: BLUE CROSS/BLUE SHIELD

## 2015-09-08 ENCOUNTER — Ambulatory Visit (INDEPENDENT_AMBULATORY_CARE_PROVIDER_SITE_OTHER): Payer: BLUE CROSS/BLUE SHIELD | Admitting: *Deleted

## 2015-09-08 ENCOUNTER — Ambulatory Visit (INDEPENDENT_AMBULATORY_CARE_PROVIDER_SITE_OTHER)
Admission: RE | Admit: 2015-09-08 | Discharge: 2015-09-08 | Disposition: A | Discharge status: Home / Self Care | Payer: BLUE CROSS/BLUE SHIELD | Source: Ambulatory Visit | Attending: Family Medicine | Admitting: Family Medicine

## 2015-09-08 ENCOUNTER — Telehealth: Payer: Self-pay | Admitting: Family Medicine

## 2015-09-08 DIAGNOSIS — R202 Paresthesia of skin: Secondary | ICD-10-CM | POA: Diagnosis not present

## 2015-09-08 DIAGNOSIS — E291 Testicular hypofunction: Secondary | ICD-10-CM

## 2015-09-08 DIAGNOSIS — R200 Anesthesia of skin: Secondary | ICD-10-CM

## 2015-09-08 DIAGNOSIS — E230 Hypopituitarism: Secondary | ICD-10-CM

## 2015-09-08 DIAGNOSIS — G5793 Unspecified mononeuropathy of bilateral lower limbs: Secondary | ICD-10-CM

## 2015-09-08 DIAGNOSIS — E559 Vitamin D deficiency, unspecified: Secondary | ICD-10-CM

## 2015-09-08 LAB — VITAMIN D 25 HYDROXY (VIT D DEFICIENCY, FRACTURES): VITD: 38.11 ng/mL (ref 30.00–100.00)

## 2015-09-08 MED ORDER — TESTOSTERONE CYPIONATE 200 MG/ML IM SOLN
200.0000 mg | Freq: Once | INTRAMUSCULAR | Status: AC
  Administered 2015-09-08: 200 mg via INTRAMUSCULAR

## 2015-09-08 NOTE — Progress Notes (Signed)
Patient presents today for testosterone injection. Patient tolerated injection well. Next injection in 2 weeks.
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
## 2015-09-08 NOTE — Telephone Encounter (Signed)
Spoke with Patient reviewed results and instructions. Patient verbalized understanding. Patient willing to have doppler done.

## 2015-09-19 ENCOUNTER — Ambulatory Visit: Payer: BLUE CROSS/BLUE SHIELD

## 2015-09-22 ENCOUNTER — Ambulatory Visit (INDEPENDENT_AMBULATORY_CARE_PROVIDER_SITE_OTHER): Payer: BLUE CROSS/BLUE SHIELD | Admitting: *Deleted

## 2015-09-22 DIAGNOSIS — E291 Testicular hypofunction: Secondary | ICD-10-CM | POA: Diagnosis not present

## 2015-09-22 MED ORDER — TESTOSTERONE CYPIONATE 200 MG/ML IM SOLN
200.0000 mg | Freq: Once | INTRAMUSCULAR | Status: AC
  Administered 2015-09-22: 200 mg via INTRAMUSCULAR

## 2015-09-22 NOTE — Progress Notes (Signed)
Patient presents today for testosterone injection. Patient tolerated injection well.

## 2015-10-03 ENCOUNTER — Ambulatory Visit: Payer: BLUE CROSS/BLUE SHIELD

## 2018-01-05 IMAGING — DX DG LUMBAR SPINE COMPLETE 4+V
5 series · 5 of 5 positions shown · non-contrast
Comparison: None.

CLINICAL DATA: Numbness and tingling in the lower extremities.

EXAM:
LUMBAR SPINE - COMPLETE 4+ VIEW

[l-spine ap]
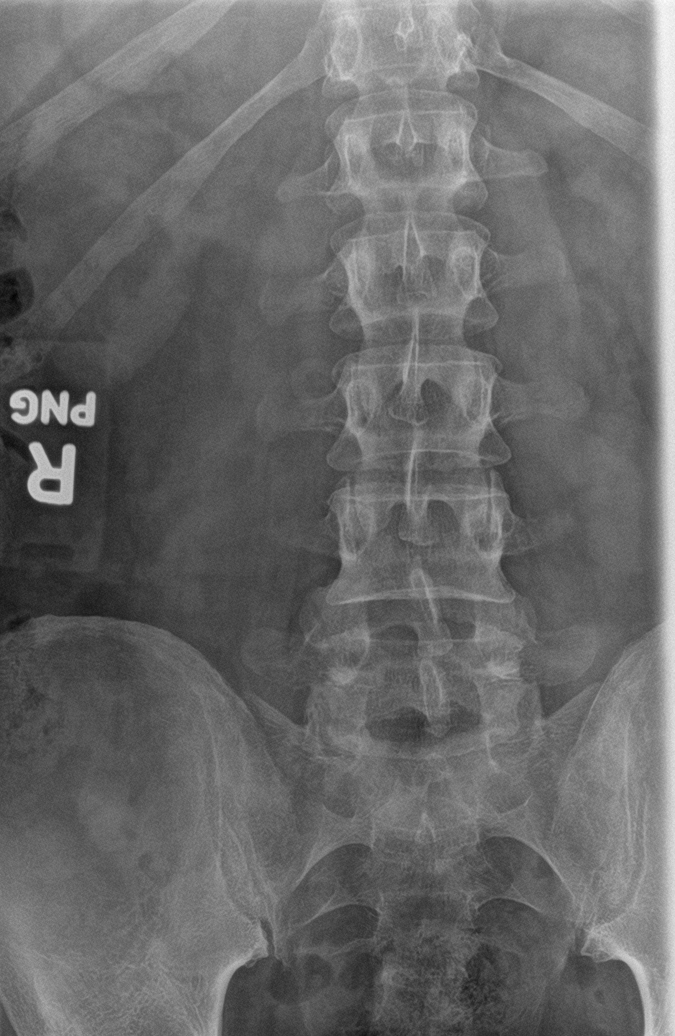

[l-spine obl (1 of 2)]
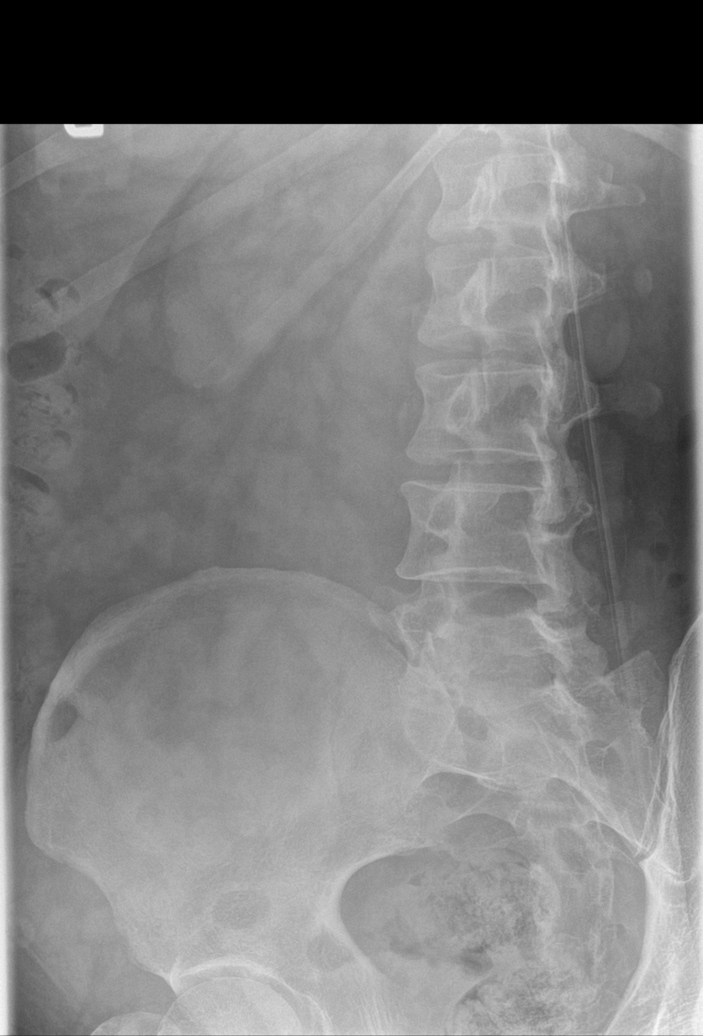

[l-spine obl (2 of 2)]
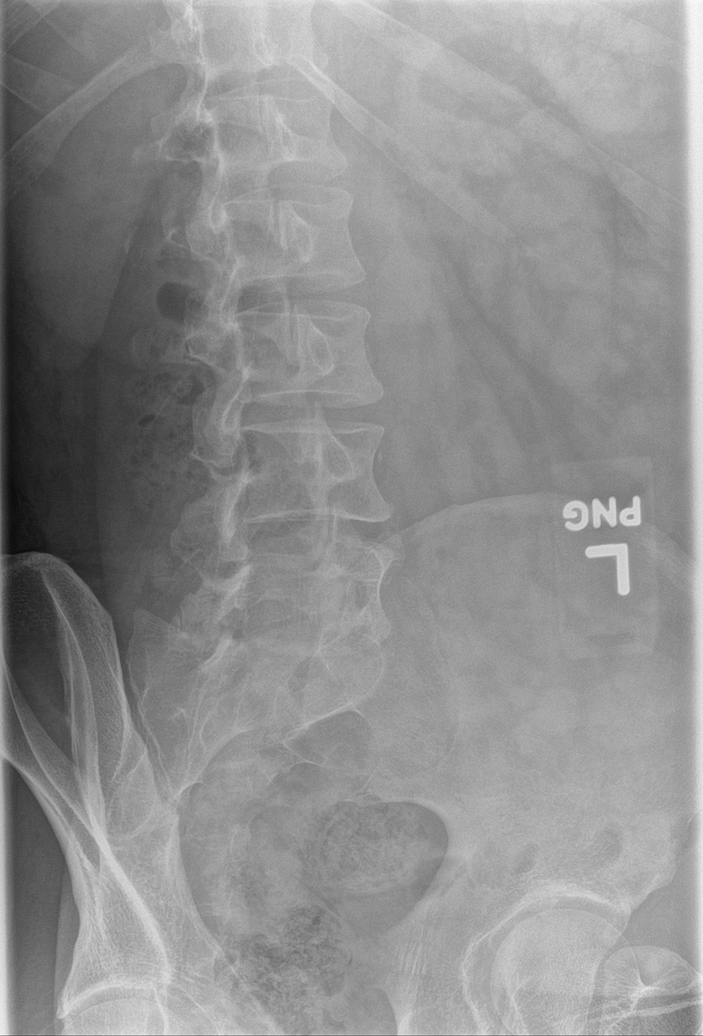

[l-spine lat]
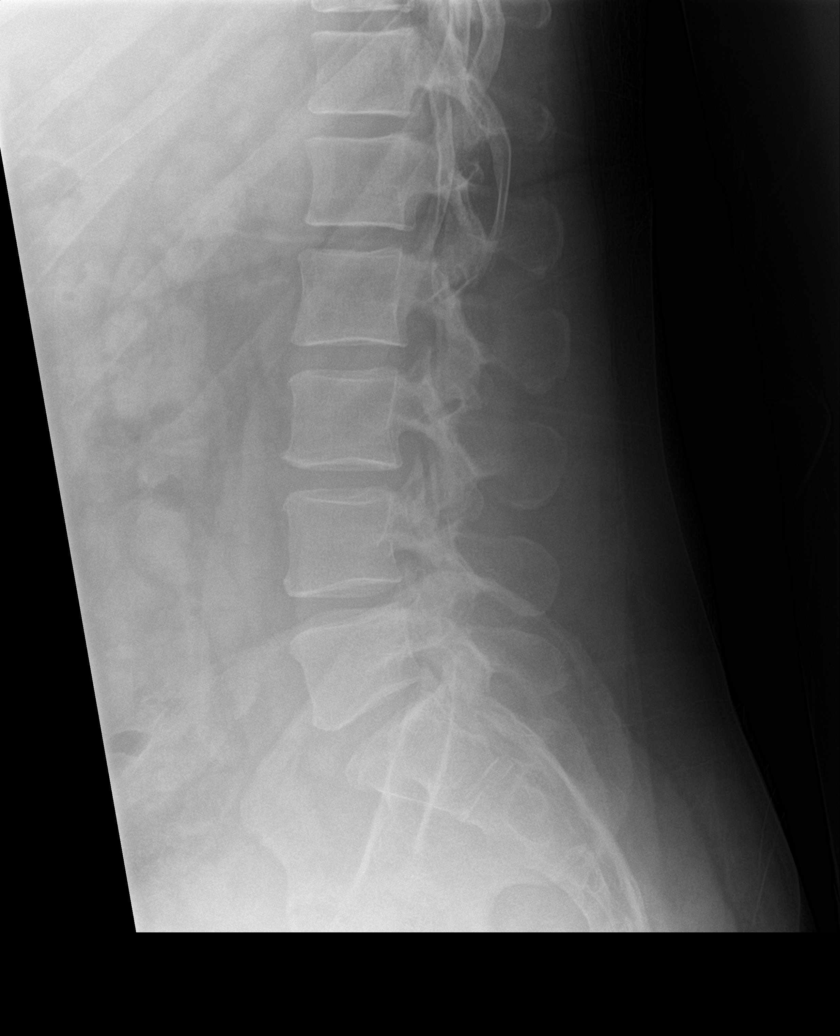

[l-spine spot]
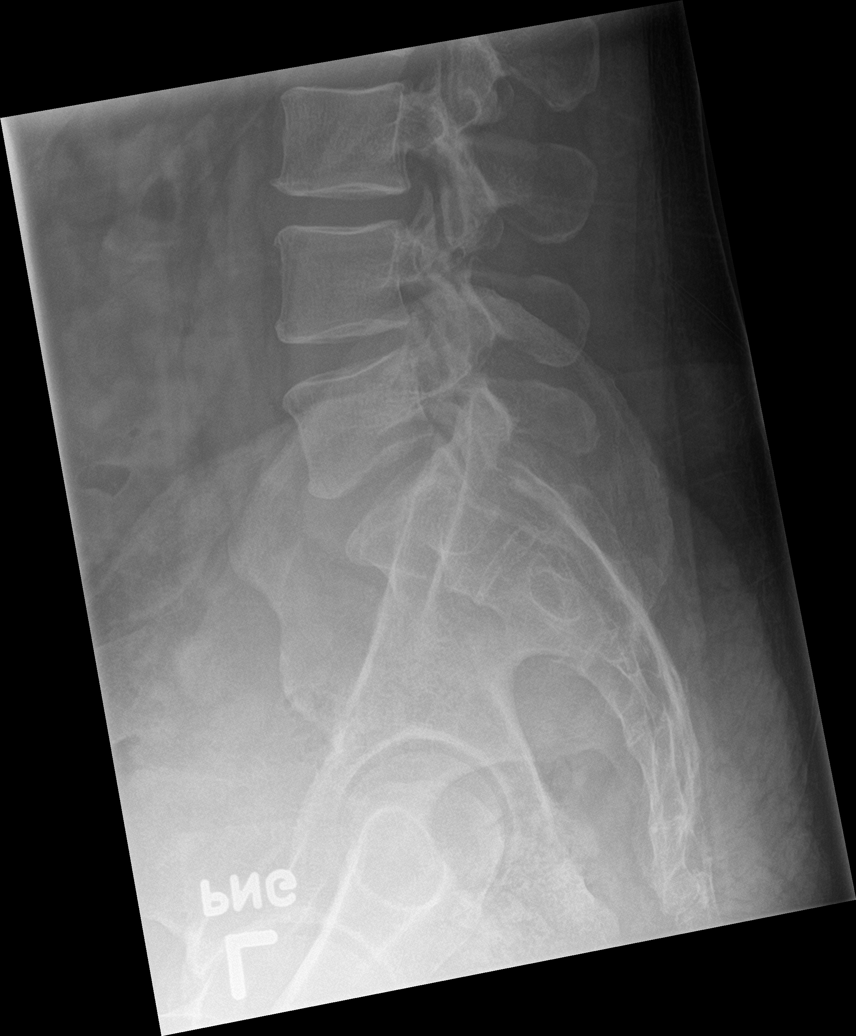

[5 of 5 positions shown; findings below may reference images not displayed]

FINDINGS: This report assumes 5 non rib-bearing lumbar vertebrae.

Lumbar vertebral body heights are preserved, with no fracture.

Lumbar disc heights are preserved. Minimal spondylosis at L3-4. No
spondylolisthesis. No appreciable facet arthropathy. No aggressive
appearing focal osseous lesions.
IMPRESSION: 1. No fracture or spondylolisthesis.
2. Minimal spondylosis.

## 2023-03-23 HISTORY — PX: CATHETER REMOVAL: SHX911

## 2023-11-15 ENCOUNTER — Encounter: Payer: Self-pay | Admitting: Neurology
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
## 2024-01-11 ENCOUNTER — Encounter: Payer: Self-pay | Admitting: Neurology

## 2024-01-11 ENCOUNTER — Ambulatory Visit (INDEPENDENT_AMBULATORY_CARE_PROVIDER_SITE_OTHER): Admitting: Neurology

## 2024-01-11 ENCOUNTER — Other Ambulatory Visit

## 2024-01-11 VITALS — BP 140/98 | HR 82 | Ht 75.0 in | Wt 295.0 lb

## 2024-01-11 DIAGNOSIS — M542 Cervicalgia: Secondary | ICD-10-CM

## 2024-01-11 DIAGNOSIS — R200 Anesthesia of skin: Secondary | ICD-10-CM

## 2024-01-11 DIAGNOSIS — G4486 Cervicogenic headache: Secondary | ICD-10-CM

## 2024-01-11 DIAGNOSIS — R202 Paresthesia of skin: Secondary | ICD-10-CM

## 2024-01-11 DIAGNOSIS — Z131 Encounter for screening for diabetes mellitus: Secondary | ICD-10-CM
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
## 2024-01-16 ENCOUNTER — Ambulatory Visit: Payer: Self-pay | Admitting: Neurology

## 2024-01-16 NOTE — Progress Notes (Signed)
Tried calling patient no answer. LMOVM to call the office back.

## 2024-01-16 NOTE — Progress Notes (Signed)
 Can we call patient about lab results? His B1 (aka thiamine) is very low. This could be at least contributing to his symptoms. I need him to get B1 (thiamine) 100 mg and start taking this every day. I recommend he do this as soon as possible as B1 can cause a lot of symptoms and would not want things to get worse. He can get B1 over the counter at any drug store or online.  Thank you.

## 2024-01-17 NOTE — Progress Notes (Signed)
 Patient advised.

## 2024-01-18 LAB — IMMUNOFIXATION ELECTROPHORESIS
IgM, Serum: 105 mg/dL (ref 50–300)
IgM, Serum: 1185 mg/dL (ref 600–300)
Immunoglobulin A: 1185 mg/dL (ref 600–310)
Immunoglobulin A: 222 mg/dL (ref 47–310)

## 2024-01-18 LAB — HEMOGLOBIN A1C
Hgb A1c MFr Bld: 5.3 % (ref <5.7)
Mean Plasma Glucose: 105 mg/dL
eAG (mmol/L): 5.8 mmol/L

## 2024-01-18 LAB — VITAMIN B1: Vitamin B1 (Thiamine): <6 nmol/L — ABNORMAL LOW (ref 8–30)

## 2024-01-18 LAB — VITAMIN B12: Vitamin B-12: 365 pg/mL (ref 200–1100)

## 2024-02-22 ENCOUNTER — Ambulatory Visit: Admitting: Neurology

## 2024-03-05 ENCOUNTER — Encounter: Payer: Self-pay | Admitting: Neurology

## 2024-03-05 ENCOUNTER — Ambulatory Visit: Admitting: Neurology

## 2024-03-05 DIAGNOSIS — M542 Cervicalgia: Secondary | ICD-10-CM

## 2024-03-05 DIAGNOSIS — G629 Polyneuropathy, unspecified: Secondary | ICD-10-CM

## 2024-03-05 DIAGNOSIS — G5601 Carpal tunnel syndrome, right upper limb: Secondary | ICD-10-CM

## 2024-03-05 NOTE — Procedures (Signed)
 [facility]  [address] [place], [address]  [place], [place] [address] Tel: [phone] Fax: [phone] Test Date:  03/05/2024  Patient: Andrew Peck DOB: 1966/03/13 Physician: Venetia Potters, MD  Sex: Male Height: 6' 3 Ref Phys: Venetia Potters, MD  ID#: [id number]   Technician:    History: This is a 58 year old male with numbness and tingling of feet and right hand.  NCV & EMG Findings: Extensive electrodiagnostic evaluation of the right upper and lower limbs with additional nerve conduction studies of the left lower limb shows: Bilateral sural and right superficial peroneal/fibular sensory responses are absent. Right median-ulnar palmar sensory response shows prolonged distal peak latency (Median Palm-Wrist, 2.3 ms) and abnormal peak latency difference ((Median Palm-Wrist)-(Ulnar Palm-Wrist), 0.47 ms). Right median and ulnar sensory responses are within normal limits. Bilateral peroneal/fibular (EDB) motor responses are absent. Bilateral tibial (AH) motor responses show reduced amplitude (L1.80, R2.8 mV). Right peroneal/fibular (TA), median (APB), and ulnar (ADM) motor responses are within normal limits. Right H reflex latency is within normal limits. Chronic motor axon loss changes without accompanying active denervation changes are seen in the right tibialis anterior and right medial head of gastrocnemius muscles.  Impression: This is an abnormal study. The findings are most consistent with the following: Evidence of a length dependent, large fiber, sensorimotor polyneuropathy, axon loss in type, moderate in degree electrically. Right median mononeuropathy at or distal to the wrist, consistent with carpal tunnel syndrome, very mild in degree electrically. No electrodiagnostic evidence of a right cervical (C5-C8) or lumbosacral (L3-S1) motor radiculopathy.    ___________________________ Venetia Potters, MD    Nerve Conduction Studies Motor Nerve Results    Latency  Amplitude F-Lat Segment Distance CV Comment  Site (ms) Norm (mV) Norm (ms)  (cm) (m/s) Norm   Left Fibular (EDB) Motor  Ankle *NR  < 6.0 *NR  > 2.5        Right Fibular (EDB) Motor  Ankle *NR  < 6.0 *NR  > 2.5        Bel fib head *NR - *NR -  Bel fib head-Ankle - *NR  > 40   Pop fossa *NR - *NR -  Pop fossa-Bel fib head - *NR -   Right Fibular (TA) Motor  Fib head 2.1  < 4.5 6.4  > 3.0        Pop fossa 3.9  < 6.7 6.2 -  Pop fossa-Fib head 9.5 53  > 40   Right Median (APB) Motor  Wrist 2.8  < 4.0 10.1  > 6.0        Elbow 9.0 - 10.1 -  Elbow-Wrist 34 55  > 50   Left Tibial (AH) Motor  Ankle 4.2  < 6.0 *1.80  > 4.0        Right Tibial (AH) Motor  Ankle 4.2  < 6.0 *2.8  > 4.0        Knee 15.1 - 1.12 -  Knee-Ankle 49 45  > 40   Right Ulnar (ADM) Motor  Wrist 2.5  < 3.1 13.1  > 7.0        Bel elbow 7.2 - 11.6 -  Bel elbow-Wrist 26.5 56  > 50   Ab elbow 8.9 - 11.4 -  Ab elbow-Bel elbow 10 59 -    Sensory Sites    Neg Peak Lat Amplitude (O-P) Segment Distance Velocity Comment  Site (ms) Norm (V) Norm  (cm) (ms)   Right Median Sensory  Wrist-Dig II  3.5  < 3.6 25  > 15 Wrist-Dig II 13    Right Median-Ulnar Palmar Sensory       Median  Palm-Wrist *2.3  < 2.2 50  > 10 Palm-Wrist 8         Ulnar  Palm-Wrist 1.83  < 2.2 16  > 5 Palm-Wrist 8    Right Superficial Fibular Sensory  14 cm-Ankle *NR  < 4.6 *NR  > 4 14 cm-Ankle 14    Left Sural Sensory  Calf-Lat mall *NR  < 4.6 *NR  > 4 Calf-Lat mall 14    Right Sural Sensory  Calf-Lat mall *NR  < 4.6 *NR  > 4 Calf-Lat mall 14    Right Ulnar Sensory  Wrist-Dig V 3.0  < 3.1 13  > 10 Wrist-Dig V 11     H-Reflex Results    M-Lat H Lat H Neg Amp H-M Lat  Site (ms) (ms) Norm (mV) (ms)  Right Tibial H-Reflex  Pop fossa 7.2 32.2  < 35.0 2.7 25.0   Inter-Nerve Comparisons   Nerve 1 Value 1 Nerve 2 Value 2 Parameter Result Normal  Sensory Sites  R Median Palm-Wrist 2.3 ms R Ulnar Palm-Wrist 1.83 ms Peak Lat Diff *0.47 ms <0.40    Electromyography   Side Muscle Ins.Act Fibs Fasc Recrt Amp Dur Poly Activation Comment  Right Tib ant Nml Nml Nml *1- *1+ *1+ Nml Nml N/A  Right Gastroc MH Nml Nml Nml *1- *1+ *1+ Nml Nml N/A  Right Rectus fem Nml Nml Nml Nml Nml Nml Nml Nml N/A  Right Biceps fem SH Nml Nml Nml Nml Nml Nml Nml Nml N/A  Right Gluteus med Nml Nml Nml Nml Nml Nml Nml Nml N/A  Right FDI Nml Nml Nml Nml Nml Nml Nml Nml N/A  Right Pronator teres Nml Nml Nml Nml Nml Nml Nml Nml N/A  Right Biceps Nml Nml Nml Nml Nml Nml Nml Nml N/A  Right Triceps lat hd Nml Nml Nml Nml Nml Nml Nml Nml N/A  Right Deltoid Nml Nml Nml Nml Nml Nml Nml Nml N/A      Waveforms:  Motor                  Sensory               H-Reflex
